# Patient Record
Sex: Female | Born: 1965 | Race: Black or African American | Hispanic: No | Marital: Married | Smoking: Never smoker
Health system: Southern US, Community
[De-identification: ages and names within clinical notes are randomized; demographics above are authoritative.]

## PROBLEM LIST (undated history)

## (undated) DIAGNOSIS — I471 Supraventricular tachycardia, unspecified: Secondary | ICD-10-CM

## (undated) DIAGNOSIS — E785 Hyperlipidemia, unspecified: Secondary | ICD-10-CM

## (undated) HISTORY — PX: UTERINE FIBROID EMBOLIZATION: SHX825

---

## 2004-04-27 ENCOUNTER — Encounter: Admission: RE | Admit: 2004-04-27 | Discharge: 2004-04-27 | Payer: Self-pay | Admitting: Obstetrics and Gynecology

## 2004-05-05 ENCOUNTER — Encounter: Admission: RE | Admit: 2004-05-05 | Discharge: 2004-05-05 | Payer: Self-pay | Admitting: Internal Medicine

## 2004-07-12 ENCOUNTER — Observation Stay (HOSPITAL_COMMUNITY): Admission: RE | Admit: 2004-07-12 | Discharge: 2004-07-13 | Payer: Self-pay | Admitting: Interventional Radiology

## 2005-01-25 ENCOUNTER — Encounter: Admission: RE | Admit: 2005-01-25 | Discharge: 2005-01-25 | Payer: Self-pay | Admitting: Obstetrics and Gynecology

## 2010-09-03 ENCOUNTER — Encounter: Payer: Self-pay | Admitting: Obstetrics and Gynecology

## 2014-03-23 ENCOUNTER — Encounter: Payer: Commercial Managed Care - PPO | Attending: Obstetrics and Gynecology | Admitting: Dietician

## 2014-03-23 ENCOUNTER — Encounter: Payer: Self-pay | Admitting: Dietician

## 2014-03-23 VITALS — Ht 67.0 in | Wt 130.7 lb

## 2014-03-23 DIAGNOSIS — R7309 Other abnormal glucose: Secondary | ICD-10-CM | POA: Diagnosis present

## 2014-03-23 DIAGNOSIS — Z713 Dietary counseling and surveillance: Secondary | ICD-10-CM | POA: Insufficient documentation

## 2014-03-23 DIAGNOSIS — R7303 Prediabetes: Secondary | ICD-10-CM

## 2014-03-23 NOTE — Progress Notes (Signed)
  Medical Nutrition Therapy:  Appt start time: 1400 end time:  1445.   Assessment:  Primary concerns today: Leslie Summers reports [facility] for high HgbA1c (5.9%). 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 Although she works until [employer]am-3pm 4 days week, Leslie Summers reports that she stays "busy all the time". She reports that her mom was recently diagnosed with type 2 diabetes; manages diabetes with diet and Metformin. Leslie Summers states that she tries not to skip meals and avoids fried foods. Exercises inconsistently. She states that she is considered premenopausal.  Preferred Learning Style:  No preference indicated   Learning Readiness:   Ready  MEDICATIONS: vitamin D and Lexapro   DIETARY INTAKE:  Usual eating pattern includes 3 meals and a few snacks per day. Avoided foods include pork, red meat, fried food.    24-hr recall:  B (9 AM): coffee, mandarin oranges or other fruit, Malaw[place]urkey sausage biscuit  Snk ( AM): peanut butter crackers  L ( PM): sandwich or leftovers Snk ( PM): bakery items at work D (4 PM): grilled chicken, potatoes, green beans OR hamburger or hot dog Snk ( PM): chips or ice cream or chocolate  Beverages: coffee with flavored creamer, lemonade, wine occasionally, seldom Mtn Dew or coke, water  Usual physical activity: walk, hula hoop, push ups (3x a week for about 20 minutes)   Estimated energy needs: 1800-2000 calories 200-225 g carbohydrates 135-150 g protein 50-56 g fat  Progress Towards Goal(s):  In progress.   Nutritional Diagnosis:  Altered nutrition-related value as related to inappropriate food choices, family history of diabetes, and physical inactivity as evidenced by HgbA1c 5.9%.    Intervention:  Nutrition counseling provided. Explained basic carbohydrate metabolism. Encouraged steady carbohydrate intake throughout the day. Recommended increasing physical activity.  -Increase physical activity  -3x a week  for 30 minutes -Have a protein food with meals and snacks  -Keep healthy snacks on hand at work -Fill up on non-starchy vegetables (any veggie that's not corn, peas, or potatoes)  -Refer to MyPlate method -Cut down on coffee creamer little by little  -Train your taste buds to prefer less sugar -Try adding fruit to water -Be mindful of having a consistent amount of carbohydrates throughout the day -Try having 1/2 [employer] bagel  Teaching Method Utilized:  Visual Auditory  Handouts given during visit include:  15g CHO + protein snacks  MyPlate  Carb counting/servings card  Barriers to learning/adherence to lifestyle change: none  Demonstrated degree of understanding via:  Teach Back   Monitoring/Evaluation:  Dietary intake, exercise, labs, and body weight in 3 month(s).

## 2014-03-23 NOTE — Patient Instructions (Addendum)
-  Increase physical activity  -3x a week for 30 minutes  -Have a protein food with meals and snacks  -Keep healthy snacks on hand at work   -Fill up on non-starchy vegetables (any veggie that's not corn, peas, or potatoes)  -Refer to MyPlate method  -Cut down on coffee creamer little by little  -Train your taste buds to prefer less sugar  -Try adding fruit to water  -Be mindful of having a consistent amount of carbohydrates throughout the day  -Try having 1/2 [employer] bagel

## 2014-06-23 ENCOUNTER — Ambulatory Visit: Payer: Commercial Managed Care - PPO | Admitting: Dietician

## 2015-01-28 ENCOUNTER — Inpatient Hospital Stay (HOSPITAL_COMMUNITY)
Admission: EM | Admit: 2015-01-28 | Discharge: 2015-01-29 | DRG: 287 | Disposition: A | Discharge status: Home / Self Care | Payer: Managed Care, Other (non HMO) | Attending: Cardiology | Admitting: Cardiology

## 2015-01-28 ENCOUNTER — Emergency Department (HOSPITAL_COMMUNITY): Payer: Managed Care, Other (non HMO)

## 2015-01-28 ENCOUNTER — Inpatient Hospital Stay (HOSPITAL_COMMUNITY): Payer: Managed Care, Other (non HMO)

## 2015-01-28 ENCOUNTER — Encounter (HOSPITAL_COMMUNITY): Payer: Self-pay | Admitting: Emergency Medicine

## 2015-01-28 ENCOUNTER — Encounter (HOSPITAL_COMMUNITY)
Admission: EM | Disposition: A | Discharge status: Home / Self Care | Payer: Managed Care, Other (non HMO) | Source: Home / Self Care | Attending: Cardiology

## 2015-01-28 DIAGNOSIS — T43615A Adverse effect of caffeine, initial encounter: Secondary | ICD-10-CM | POA: Diagnosis present

## 2015-01-28 DIAGNOSIS — I471 Supraventricular tachycardia, unspecified: Secondary | ICD-10-CM

## 2015-01-28 DIAGNOSIS — R079 Chest pain, unspecified: Secondary | ICD-10-CM

## 2015-01-28 DIAGNOSIS — E876 Hypokalemia: Secondary | ICD-10-CM | POA: Diagnosis present

## 2015-01-28 DIAGNOSIS — I214 Non-ST elevation (NSTEMI) myocardial infarction: Secondary | ICD-10-CM | POA: Diagnosis present

## 2015-01-28 DIAGNOSIS — Z8249 Family history of ischemic heart disease and other diseases of the circulatory system: Secondary | ICD-10-CM | POA: Diagnosis not present

## 2015-01-28 DIAGNOSIS — R7989 Other specified abnormal findings of blood chemistry: Secondary | ICD-10-CM | POA: Diagnosis not present

## 2015-01-28 DIAGNOSIS — E785 Hyperlipidemia, unspecified: Secondary | ICD-10-CM

## 2015-01-28 HISTORY — DX: Supraventricular tachycardia, unspecified: I47.10

## 2015-01-28 HISTORY — DX: Supraventricular tachycardia: I47.1

## 2015-01-28 HISTORY — PX: CARDIAC CATHETERIZATION: SHX172

## 2015-01-28 HISTORY — DX: Hyperlipidemia, unspecified: E78.5

## 2015-01-28 LAB — LIPID PANEL
CHOLESTEROL: 228 mg/dL — AB (ref 0–200)
HDL: 73 mg/dL (ref >40)
LDL Cholesterol: 142 mg/dL — ABNORMAL HIGH (ref 0–99)
Total CHOL/HDL Ratio: 3.1 RATIO
Triglycerides: 63 mg/dL (ref <150)
VLDL: 13 mg/dL (ref 0–40)

## 2015-01-28 LAB — CBC WITH DIFFERENTIAL/PLATELET
Basophils Absolute: 0 10*3/uL (ref 0.0–0.1)
Basophils Relative: 0 % (ref 0–1)
Eosinophils Absolute: 0.1 10*3/uL (ref 0.0–0.7)
Eosinophils Relative: 2 % (ref 0–5)
HCT: 38.2 % (ref 36.0–46.0)
Hemoglobin: 12.7 g/dL (ref 12.0–15.0)
Lymphocytes Relative: 66 % — ABNORMAL HIGH (ref 12–46)
Lymphs Abs: 3 10*3/uL (ref 0.7–4.0)
MCH: 28.5 pg (ref 26.0–34.0)
MCHC: 33.2 g/dL (ref 30.0–36.0)
MCV: 85.7 fL (ref 78.0–100.0)
Monocytes Absolute: 0.2 10*3/uL (ref 0.1–1.0)
Monocytes Relative: 5 % (ref 3–12)
Neutro Abs: 1.2 10*3/uL — ABNORMAL LOW (ref 1.7–7.7)
Neutrophils Relative %: 27 % — ABNORMAL LOW (ref 43–77)
Platelets: 198 10*3/uL (ref 150–400)
RBC: 4.46 MIL/uL (ref 3.87–5.11)
RDW: 13.7 % (ref 11.5–15.5)
WBC: 4.6 10*3/uL (ref 4.0–10.5)

## 2015-01-28 LAB — BASIC METABOLIC PANEL
Anion gap: 9 (ref 5–15)
Anion gap: 8 (ref 5–15)
BUN: 22 mg/dL — ABNORMAL HIGH (ref 6–20)
BUN: 21 mg/dL — AB (ref 6–20)
CALCIUM: 9.3 mg/dL (ref 8.9–10.3)
CO2: 25 mmol/L (ref 22–32)
CO2: 27 mmol/L (ref 22–32)
CREATININE: 0.94 mg/dL (ref 0.44–1.00)
Calcium: 9.2 mg/dL (ref 8.9–10.3)
Chloride: 106 mmol/L (ref 101–111)
Chloride: 107 mmol/L (ref 101–111)
Creatinine, Ser: 1.08 mg/dL — ABNORMAL HIGH (ref 0.44–1.00)
GFR calc Af Amer: >60 mL/min (ref >60)
GFR calc Af Amer: >60 mL/min (ref >60)
GFR calc non Af Amer: 60 mL/min — ABNORMAL LOW (ref >60)
GFR calc non Af Amer: >60 mL/min (ref >60)
GLUCOSE: 106 mg/dL — AB (ref 65–99)
Glucose, Bld: 152 mg/dL — ABNORMAL HIGH (ref 65–99)
Potassium: 3.3 mmol/L — ABNORMAL LOW (ref 3.5–5.1)
Potassium: 3.7 mmol/L (ref 3.5–5.1)
Sodium: 140 mmol/L (ref 135–145)
Sodium: 142 mmol/L (ref 135–145)

## 2015-01-28 LAB — MAGNESIUM: Magnesium: 1.8 mg/dL (ref 1.7–2.4)

## 2015-01-28 LAB — CBC
HCT: 37.7 % (ref 36.0–46.0)
Hemoglobin: 12.2 g/dL (ref 12.0–15.0)
MCH: 27.7 pg (ref 26.0–34.0)
MCHC: 32.4 g/dL (ref 30.0–36.0)
MCV: 85.7 fL (ref 78.0–100.0)
PLATELETS: 165 10*3/uL (ref 150–400)
RBC: 4.4 MIL/uL (ref 3.87–5.11)
RDW: 13.7 % (ref 11.5–15.5)
WBC: 5.7 10*3/uL (ref 4.0–10.5)

## 2015-01-28 LAB — I-STAT TROPONIN, ED: Troponin i, poc: 0.1 ng/mL (ref 0.00–0.08)

## 2015-01-28 LAB — MRSA PCR SCREENING: MRSA by PCR: NEGATIVE

## 2015-01-28 LAB — POC URINE PREG, ED: Preg Test, Ur: NEGATIVE

## 2015-01-28 LAB — TROPONIN I
Troponin I: 0.38 ng/mL — ABNORMAL HIGH (ref <0.031)
Troponin I: 0.41 ng/mL — ABNORMAL HIGH (ref <0.031)
Troponin I: 0.41 ng/mL — ABNORMAL HIGH (ref <0.031)

## 2015-01-28 LAB — BRAIN NATRIURETIC PEPTIDE: B Natriuretic Peptide: 143.4 pg/mL — ABNORMAL HIGH (ref 0.0–100.0)

## 2015-01-28 LAB — PROTIME-INR
INR: 1.09 (ref 0.00–1.49)
PROTHROMBIN TIME: 14.3 s (ref 11.6–15.2)

## 2015-01-28 LAB — TSH: TSH: 2.183 u[IU]/mL (ref 0.350–4.500)

## 2015-01-28 LAB — HEPARIN LEVEL (UNFRACTIONATED): Heparin Unfractionated: 0.7 IU/mL (ref 0.30–0.70)

## 2015-01-28 LAB — D-DIMER, QUANTITATIVE (NOT AT ARMC)

## 2015-01-28 SURGERY — LEFT HEART CATH AND CORONARY ANGIOGRAPHY
Anesthesia: LOCAL

## 2015-01-28 MED ORDER — HEPARIN BOLUS VIA INFUSION
4000.0000 [IU] | Freq: Once | INTRAVENOUS | Status: AC
  Administered 2015-01-28: 4000 [IU] via INTRAVENOUS
  Filled 2015-01-28: qty 4000

## 2015-01-28 MED ORDER — SODIUM CHLORIDE 0.9 % WEIGHT BASED INFUSION
3.0000 mL/kg/h | INTRAVENOUS | Status: AC
  Administered 2015-01-28: 3 mL/kg/h via INTRAVENOUS

## 2015-01-28 MED ORDER — HEPARIN (PORCINE) IN NACL 100-0.45 UNIT/ML-% IJ SOLN
750.0000 [IU]/h | INTRAMUSCULAR | Status: DC
  Administered 2015-01-28: 750 [IU]/h via INTRAVENOUS
  Filled 2015-01-28: qty 250

## 2015-01-28 MED ORDER — IOHEXOL 350 MG/ML SOLN
INTRAVENOUS | Status: DC | PRN
  Administered 2015-01-28: 75 mL via INTRAVENOUS

## 2015-01-28 MED ORDER — HEART ATTACK BOUNCING BOOK
Freq: Once | Status: AC
  Administered 2015-01-28: 18:00:00
  Filled 2015-01-28: qty 1

## 2015-01-28 MED ORDER — MIDAZOLAM HCL 2 MG/2ML IJ SOLN
INTRAMUSCULAR | Status: AC
  Filled 2015-01-28: qty 2

## 2015-01-28 MED ORDER — ASPIRIN 325 MG PO TABS
325.0000 mg | ORAL_TABLET | Freq: Once | ORAL | Status: AC
  Administered 2015-01-28: 325 mg via ORAL
  Filled 2015-01-28: qty 1

## 2015-01-28 MED ORDER — VERAPAMIL HCL 2.5 MG/ML IV SOLN
INTRAVENOUS | Status: AC
  Filled 2015-01-28: qty 2

## 2015-01-28 MED ORDER — ASPIRIN EC 81 MG PO TBEC
81.0000 mg | DELAYED_RELEASE_TABLET | Freq: Every day | ORAL | Status: DC
  Administered 2015-01-29: 81 mg via ORAL
  Filled 2015-01-28: qty 1

## 2015-01-28 MED ORDER — VERAPAMIL HCL 2.5 MG/ML IV SOLN
INTRAVENOUS | Status: DC | PRN
  Administered 2015-01-28: 17:00:00 via INTRA_ARTERIAL

## 2015-01-28 MED ORDER — SODIUM CHLORIDE 0.9 % IJ SOLN: 3.0000 mL | INTRAMUSCULAR | Status: DC | PRN

## 2015-01-28 MED ORDER — HEPARIN (PORCINE) IN NACL 2-0.9 UNIT/ML-% IJ SOLN
INTRAMUSCULAR | Status: AC
  Filled 2015-01-28: qty 1500

## 2015-01-28 MED ORDER — SODIUM CHLORIDE 0.9 % IJ SOLN: 3.0000 mL | Freq: Two times a day (BID) | INTRAMUSCULAR | Status: DC

## 2015-01-28 MED ORDER — LIDOCAINE HCL (PF) 1 % IJ SOLN
INTRAMUSCULAR | Status: AC
  Filled 2015-01-28: qty 30

## 2015-01-28 MED ORDER — ATORVASTATIN CALCIUM 80 MG PO TABS
80.0000 mg | ORAL_TABLET | Freq: Every day | ORAL | Status: DC
  Administered 2015-01-28: 80 mg via ORAL
  Filled 2015-01-28: qty 1

## 2015-01-28 MED ORDER — HEPARIN SODIUM (PORCINE) 1000 UNIT/ML IJ SOLN
INTRAMUSCULAR | Status: DC | PRN
  Administered 2015-01-28: 3000 [IU] via INTRAVENOUS

## 2015-01-28 MED ORDER — MIDAZOLAM HCL 2 MG/2ML IJ SOLN
INTRAMUSCULAR | Status: DC | PRN
  Administered 2015-01-28: 1 mg via INTRAVENOUS

## 2015-01-28 MED ORDER — SODIUM CHLORIDE 0.9 % IV SOLN: 250.0000 mL | INTRAVENOUS | Status: DC | PRN

## 2015-01-28 MED ORDER — METOPROLOL TARTRATE 12.5 MG HALF TABLET
12.5000 mg | ORAL_TABLET | Freq: Two times a day (BID) | ORAL | Status: DC
  Administered 2015-01-29: 12.5 mg via ORAL
  Filled 2015-01-28 (×2): qty 1

## 2015-01-28 MED ORDER — FENTANYL CITRATE (PF) 100 MCG/2ML IJ SOLN
INTRAMUSCULAR | Status: DC | PRN
  Administered 2015-01-28: 25 ug via INTRAVENOUS

## 2015-01-28 MED ORDER — NITROGLYCERIN 1 MG/10 ML FOR IR/CATH LAB
INTRA_ARTERIAL | Status: AC
  Filled 2015-01-28: qty 10

## 2015-01-28 MED ORDER — ACETAMINOPHEN 325 MG PO TABS: 650.0000 mg | ORAL_TABLET | ORAL | Status: DC | PRN

## 2015-01-28 MED ORDER — NITROGLYCERIN 0.4 MG SL SUBL
0.4000 mg | SUBLINGUAL_TABLET | SUBLINGUAL | Status: DC | PRN
  Administered 2015-01-28 (×2): 0.4 mg via SUBLINGUAL
  Filled 2015-01-28: qty 1

## 2015-01-28 MED ORDER — ESCITALOPRAM OXALATE 10 MG PO TABS
10.0000 mg | ORAL_TABLET | Freq: Every day | ORAL | Status: DC
  Administered 2015-01-28 – 2015-01-29 (×2): 10 mg via ORAL
  Filled 2015-01-28 (×2): qty 1

## 2015-01-28 MED ORDER — FENTANYL CITRATE (PF) 100 MCG/2ML IJ SOLN
INTRAMUSCULAR | Status: AC
  Filled 2015-01-28: qty 2

## 2015-01-28 MED ORDER — NITROGLYCERIN 0.4 MG SL SUBL: 0.4000 mg | SUBLINGUAL_TABLET | SUBLINGUAL | Status: DC | PRN

## 2015-01-28 MED ORDER — HEPARIN SODIUM (PORCINE) 1000 UNIT/ML IJ SOLN
INTRAMUSCULAR | Status: AC
  Filled 2015-01-28: qty 1

## 2015-01-28 MED ORDER — ONDANSETRON HCL 4 MG/2ML IJ SOLN: 4.0000 mg | Freq: Four times a day (QID) | INTRAMUSCULAR | Status: DC | PRN

## 2015-01-28 MED ORDER — SODIUM CHLORIDE 0.9 % IV SOLN
INTRAVENOUS | Status: DC
  Administered 2015-01-28: 12:00:00 via INTRAVENOUS

## 2015-01-28 MED ORDER — ASPIRIN 81 MG PO CHEW: 81.0000 mg | CHEWABLE_TABLET | ORAL | Status: DC

## 2015-01-28 SURGICAL SUPPLY — 10 items
CATH INFINITI 5FR ANG PIGTAIL (CATHETERS) ×2 IMPLANT
CATH OPTITORQUE TIG 4.0 5F (CATHETERS) ×2 IMPLANT
DEVICE RAD COMP TR BAND LRG (VASCULAR PRODUCTS) ×2 IMPLANT
GLIDESHEATH SLEND A-KIT 6F 22G (SHEATH) ×2 IMPLANT
KIT HEART LEFT (KITS) ×2 IMPLANT
PACK CARDIAC CATHETERIZATION (CUSTOM PROCEDURE TRAY) ×2 IMPLANT
SYR MEDRAD MARK V 150ML (SYRINGE) ×2 IMPLANT
TRANSDUCER W/STOPCOCK (MISCELLANEOUS) ×2 IMPLANT
TUBING CIL FLEX 10 FLL-RA (TUBING) ×2 IMPLANT
WIRE SAFE-T 1.5MM-J .035X260CM (WIRE) ×2 IMPLANT

## 2015-01-28 NOTE — ED Provider Notes (Signed)
CSN: [id number]     Arrival date & time 01/28/15  0115 History  This chart was scribed for Tomasita Crumble, MD by Freida Busman, ED Scribe. This patient was seen in room A03C/A03C and the patient's care was started 2:34 AM.    Chief Complaint  Patient presents with  . Chest Pain    The patient started at 1800 along with palpitations. THe patient thought it was a mountain dew that she had taken.  She thought it would go away and it got worse.     The history is provided by the patient. No language interpreter was used.     HPI Comments:  Leslie Summers is a 49 y.o. female who presents to the Emergency Department complaining of palpitations and CP that started ~1800 last night. Pt describes her pain as "someone sitting on her chest". Pt was eating at time of onset. She states initial episode lasted a few hours and improved by the time she went to bed; notes she woke up with same pain this am. At this time pt notes pain has improved. She reports associated nausea, 1 episode of vomiting, and mild SOB which has resolved. She denies diaphoresis, BLE edema. She also denies h/o blood clots, HTN, DM and MI. No alleviating factors noted; no treatments tried PTA.   History reviewed. No pertinent past medical history. History reviewed. No pertinent past surgical history. Family History  Problem Relation Age of Onset  . Diabetes Mother    History  Substance Use Topics  . Smoking status: Never Smoker   . Smokeless tobacco: Never Used  . Alcohol Use: Yes     Comment: occ   OB History    No data available     Review of Systems  A complete 10 system review of systems was obtained and all systems are negative except as noted in the HPI and PMH.    Allergies  Review of patient's allergies indicates not on file.  Home Medications   Prior to Admission medications   Medication Sig Start Date End Date Taking? Authorizing Provider  ergocalciferol (VITAMIN D2) 50000 UNITS capsule Take 50,000 Units  by mouth once a week.    Historical Provider, MD  escitalopram (LEXAPRO) 10 MG tablet Take 10 mg by mouth daily.    Historical Provider, MD   BP 108/74 mmHg  Pulse 91  Temp(Src) 98.2 F (36.8 C) (Oral)  Resp 24  Wt 138 lb (62.596 kg)  SpO2 98%  LMP 04/29/2013 Physical Exam  Constitutional: She is oriented to person, place, and time. She appears well-developed and well-nourished. No distress.  HENT:  Head: Normocephalic and atraumatic.  Nose: Nose normal.  Mouth/Throat: Oropharynx is clear and moist. No oropharyngeal exudate.  Eyes: Conjunctivae and EOM are normal. Pupils are equal, round, and reactive to light. No scleral icterus.  Neck: Normal range of motion. Neck supple. No JVD present. No tracheal deviation present. No thyromegaly present.  Cardiovascular: Normal rate, regular rhythm and normal heart sounds.  Exam reveals no gallop and no friction rub.   No murmur heard. Pulmonary/Chest: Effort normal and breath sounds normal. No respiratory distress. She has no wheezes. She exhibits no tenderness.  Abdominal: Soft. Bowel sounds are normal. She exhibits no distension and no mass. There is no tenderness. There is no rebound and no guarding.  Musculoskeletal: Normal range of motion. She exhibits no edema or tenderness.  Lymphadenopathy:    She has no cervical adenopathy.  Neurological: She is alert and oriented to  person, place, and time. No cranial nerve deficit. She exhibits normal muscle tone.  Skin: Skin is warm and dry. No rash noted. No erythema. No pallor.  Nursing note and vitals reviewed.   ED Course  Procedures   DIAGNOSTIC STUDIES:  Oxygen Saturation is 98% on RA, normal by my interpretation.    COORDINATION OF CARE:  2:36 AM Pt updated with partial results. Informed pt of need for hospital admission.  Discussed treatment plan with pt at bedside and pt agreed to plan.  3:14 AM Discussed case with Jesusita Oka with cardiology   Labs Review Labs Reviewed  CBC WITH  DIFFERENTIAL/PLATELET - Abnormal; Notable for the following:    Neutrophils Relative % 27 (*)    Neutro Abs 1.2 (*)    Lymphocytes Relative 66 (*)    All other components within normal limits  BASIC METABOLIC PANEL - Abnormal; Notable for the following:    Potassium 3.3 (*)    Glucose, Bld 152 (*)    BUN 22 (*)    Creatinine, Ser 1.08 (*)    GFR calc non Af Amer 60 (*)    All other components within normal limits  I-STAT TROPOININ, ED - Abnormal; Notable for the following:    Troponin i, poc 0.10 (*)    All other components within normal limits  POC URINE PREG, ED    Imaging Review Dg Chest 2 View  01/28/2015   CLINICAL DATA:  Central chest pain, onset today.  Palpitations.  EXAM: CHEST  2 VIEW  COMPARISON:  None.  FINDINGS: Hyperinflation. Scarring in the lung apices, likely postinflammatory. Suggestion of mild central bronchiectasis. Normal heart size and pulmonary vascularity. No focal airspace disease or consolidation in the lungs. No blunting of costophrenic angles. No pneumothorax. Mediastinal contours appear intact.  IMPRESSION: Hyperinflation. Postinflammatory scarring in the lung apices. Suggestion of mild central bronchiectasis. No evidence of active consolidation.   Electronically Signed   By: Burman Nieves M.D.   On: 01/28/2015 02:05     EKG Interpretation   Date/Time:  Friday January 28 2015 01:30:42 EDT Ventricular Rate:  98 PR Interval:  118 QRS Duration: 84 QT Interval:  354 QTC Calculation: 451 R Axis:   86 Text Interpretation:  Normal sinus rhythm Normal ECG Confirmed by Erroll Luna [phone]) on 01/28/2015 2:50:43 AM      MDM   Final diagnoses:  None   Patient presents emergency department for chest pain. History is concerning for ACS. Troponins 0.1. EKG does not show signs of ischemia. I spoke with Jesusita Oka from cardiology who will evaluate the patient in the emergency department.  Patient was tearful upon me explaining the diagnosis. She was given  nitroglycerin and full dose aspirin for management. She will require admission for further workup for NSTEMI.      CRITICAL CARE Performed by: Tomasita Crumble   Total critical care time: - NSTEMI  Critical care time was exclusive of separately billable procedures and treating other patients.  Critical care was necessary to treat or prevent imminent or life-threatening deterioration.  Critical care was time spent personally by me on the following activities: development of treatment plan with patient and/or surrogate as well as nursing, discussions with consultants, evaluation of patient's response to treatment, examination of patient, obtaining history from patient or surrogate, ordering and performing treatments and interventions, ordering and review of laboratory studies, ordering and review of radiographic studies, pulse oximetry and re-evaluation of patient's condition.   I personally performed the services described  in this documentation, which was scribed in my presence. The recorded information has been reviewed and is accurate.    Tomasita Crumble, MD 01/28/15 [phone]

## 2015-01-28 NOTE — Care Management Note (Signed)
Case Management Note  Patient Details  Name: Leslie Summers MRN: [id number] Date of Birth: July 31, 1966  Subjective/Objective:    Pt admitted for chest pain with increased cardiac enzymes. Plan for cardiac cath today.                 Action/Plan: No needs identified by CM at this time. Will continue to monitor.    Expected Discharge Date:                  Expected Discharge Plan:  Home/Self Care  In-House Referral:     Discharge planning Services  CM Consult  Post Acute Care Choice:  NA Choice offered to:  NA  DME Arranged:  N/A DME Agency:  NA  HH Arranged:    HH Agency:     Status of Service:  In process, will continue to follow  Medicare Important Message Given:  No Date Medicare IM Given:    Medicare IM give by:    Date Additional Medicare IM Given:    Additional Medicare Important Message give by:     If discussed at Long Length of Stay Meetings, dates discussed:    Additional Comments:  Gala Lewandowsky, RN 01/28/2015, 3:16 PM

## 2015-01-28 NOTE — Progress Notes (Signed)
Dr. Tresa Endo notified am dose of metoprolol held due to BP.  Colman Cater

## 2015-01-28 NOTE — Progress Notes (Signed)
UR Completed Derak Schurman Graves-Bigelow, RN,BSN [phone]  

## 2015-01-28 NOTE — Interval H&P Note (Signed)
History and Physical Interval Note:  01/28/2015 4:43 PM  Leslie Summers  has presented today for surgery, with the diagnosis of NSTEMI. The various methods of treatment have been discussed with the patient and family. After consideration of risks, benefits and other options for treatment, the patient has consented to  Procedure(s): Left Heart Cath and Coronary Angiography (N/A) With Possible Percutaneous Coronary Intervention as a surgical intervention .  The patient's history has been reviewed, patient examined, no change in status, stable for surgery.  I have reviewed the patient's chart and labs.  Questions were answered to the patient's satisfaction.     Jenah Vanasten W  Cath Lab Visit (complete for each Cath Lab visit)  Clinical Evaluation Leading to the Procedure:   ACS: Yes.    Non-ACS:    Anginal Classification: CCS IV  Anti-ischemic medical therapy: Minimal Therapy (1 class of medications)  Non-Invasive Test Results: No non-invasive testing performed  Prior CABG: No previous CABG   TIMI Score  Patient Information:  TIMI Score is 3   UA/NSTEMI and intermediate-risk features (e.g., TIMI score 3-4) for short-term risk of death or nonfatal MI  Revascularization of the presumed culprit artery   A (9)  Indication: 10; Score: 9   Micharl Helmes, Piedad Climes, M.D., M.S. Interventional Cardiologist   Pager # [phone]

## 2015-01-28 NOTE — ED Notes (Signed)
The patient started at 1800 along with palpitations. THe patient thought it was a mountain dew that she had taken.  She thought it would go away and it got worse.   She is also complaining of nausea. Vomiting, diaphoresis, and it  Lightheaded, dizzy.  She rates her pain 8/10.

## 2015-01-28 NOTE — Progress Notes (Signed)
ANTICOAGULATION CONSULT NOTE - Initial Consult  Pharmacy Consult for Heparin Indication: chest pain/ACS  No Known Allergies  Patient Measurements: Height: 5\' 6"  (167.6 cm) Weight: 134 lb 1.6 oz (60.827 kg) IBW/kg (Calculated) : 59.3 Heparin Dosing Weight: 60 kg  Vital Signs: Temp: 98.2 F (36.8 C) (06/17 1302) Temp Source: Oral (06/17 1302) BP: 106/62 mmHg (06/17 1302) Pulse Rate: 66 (06/17 1302)  Labs:  Recent Labs  01/28/15 0141 01/28/15 0555 01/28/15 0628 01/28/15 1138 01/28/15 1333  HGB 12.7  --  12.2  --   --   HCT 38.2  --  37.7  --   --   PLT 198  --  165  --   --   LABPROT  --  14.3  --   --   --   INR  --  1.09  --   --   --   HEPARINUNFRC  --   --   --   --  0.70  CREATININE 1.08*  --  0.94  --   --   TROPONINI  --   --  0.38* 0.41*  --     Estimated Creatinine Clearance: 68.5 mL/min (by C-G formula based on Cr of 0.94).   Medical History: History reviewed. No pertinent past medical history.  Medications:  Prescriptions prior to admission  Medication Sig Dispense Refill Last Dose  . escitalopram (LEXAPRO) 10 MG tablet Take 10 mg by mouth daily.   01/27/2015 at Unknown time    Assessment: 49 y.o. female presents with chest pain. To begin heparin for NSTEMI. CBC ok at baseline. Pt is getting cath today. Second level is therapeutic.  Goal of Therapy:  Heparin level 0.3-0.7 units/ml Monitor platelets by anticoagulation protocol: Yes   Plan:   Cont heparin at 750units/hr F/u after cath  Ulyses Southward, PharmD Pager: [phone] 01/28/2015 2:49 PM

## 2015-01-28 NOTE — Progress Notes (Signed)
  Echocardiogram 2D Echocardiogram has been performed.  Leslie Summers 01/28/2015, 3:10 PM

## 2015-01-28 NOTE — Progress Notes (Signed)
ANTICOAGULATION CONSULT NOTE - Initial Consult  Pharmacy Consult for Heparin Indication: chest pain/ACS  No Known Allergies  Patient Measurements: Height: 5\' 6"  (167.6 cm) Weight: 134 lb 1.6 oz (60.827 kg) IBW/kg (Calculated) : 59.3 Heparin Dosing Weight: 60 kg  Vital Signs: Temp: 98.3 F (36.8 C) (06/17 0536) Temp Source: Oral (06/17 0536) BP: 108/69 mmHg (06/17 0536) Pulse Rate: 85 (06/17 0536)  Labs:  Recent Labs  01/28/15 0141  HGB 12.7  HCT 38.2  PLT 198  CREATININE 1.08*    Estimated Creatinine Clearance: 59.6 mL/min (by C-G formula based on Cr of 1.08).   Medical History: History reviewed. No pertinent past medical history.  Medications:  Prescriptions prior to admission  Medication Sig Dispense Refill Last Dose  . escitalopram (LEXAPRO) 10 MG tablet Take 10 mg by mouth daily.   01/27/2015 at Unknown time    Assessment: 49 y.o. female presents with chest pain. To begin heparin for NSTEMI. CBC ok at baseline.  Goal of Therapy:  Heparin level 0.3-0.7 units/ml Monitor platelets by anticoagulation protocol: Yes   Plan:  Heparin IV bolus 4000 units Heparin gtt at 750 units/hr F/u 6 hr heparin level  Daily heparin level and CBC  Christoper Fabian, PharmD, BCPS Clinical pharmacist, pager [phone] 01/28/2015,5:48 AM

## 2015-01-28 NOTE — H&P (Addendum)
Patient ID: Leslie Summers MRN: [id number], DOB/AGE: [birth year]   Admit date: 01/28/2015   Primary Physician: No PCP Per Patient Primary Cardiologist: None  Pt. Profile:  49 healthy female p/w chest pain, palpitations, and positive troponin.  Problem List  History reviewed. No pertinent past medical history.  Past Surgical History  Procedure Laterality Date  . 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  She states that around 6pm, she had a [employer] and peanuts and developed palpitations that lasted for hours. She occasionally has had palpitations in the past in the setting of drinking a caffeinated beverage but they usually do not last long.  This time, palpitations lasted hours and she went to bed. She awoke up at 11-11:30pm when she had chest pain that felt like "somebody was standing on it." She had associated lightheadedness, dizziness, shortness of breath, nausea, and vomiting. 8/10 in severity. No clear modifying factors. Because of these symptoms, Leslie Summers presented for evaluation.   On arrival to the ER, she was hemodynamically stbale: HR 91, RR 24, 108/74, 98% on RA. 5/10 CP on arrival. Labs were notable for K 3.3, Cr 1.08, POC TnI 0.10.CXR demonstrated hyperinflated lungs with scarring in the apices; no PNA or CHF. ECG demosntrated NSR. She was given 325mg  PO ASA. CP reduced to zero with SL NTG x 2.   Leslie Summers is a lifelong non-smoker. Her father died of an MI in his 15s. Mother is healthy in her 32s with only hypertension. She has 7 healthy siblings. She is active, working in the OR. She walks about 20 minutes per day and has never been limited by dyspnea, CP, or claudication. No recent travel or immobilization. No recent stressors. LMP 2014.    Home Medications  Prior to Admission  medications   Medication Sig Start Date End Date Taking? Authorizing Provider  escitalopram (LEXAPRO) 10 MG tablet Take 10 mg by mouth daily.   Yes Historical Provider, MD    Family History  Family History  Problem Relation Age of Onset  . Diabetes Mother     Social History  History   Social History  . Marital Status: Married    Spouse Name: N/A  . Number of Children: N/A  . Years of Education: N/A   Occupational History  . Not on file.   Social History Main Topics  . Smoking status: Never Smoker   . Smokeless tobacco: Never Used  . Alcohol Use: Yes     Comment: occ  . Drug Use: No  . Sexual Activity: Yes    Birth Control/ Protection: None   Other Topics Concern  . Not on file   Social History Narrative     Review of Systems General:  No chills, fever, night sweats or weight changes.  Cardiovascular:  See HPI Dermatological: No rash, lesions/masses Respiratory: No cough, dyspnea Urologic: No hematuria, dysuria Abdominal:   + nausea, vomiting, diarrhea, bright red blood per rectum, melena, or hematemesis Neurologic:  No visual changes, wkns, changes in mental status. All other systems reviewed and are otherwise negative except as noted above.  Physical Exam  Blood pressure 100/78, pulse 82, temperature 98.2 F (36.8 C), temperature source Oral, resp. rate 14, weight 62.596 kg (138 lb), last menstrual period 04/29/2013, SpO2 98 %.  General: Pleasant, NAD Psych:  Normal affect. Neuro: Alert and oriented X 3. Moves all extremities spontaneously. HEENT: Normal  Neck: Supple without bruits or JVD. Lungs:  Resp regular and unlabored, CTA. Heart: RRR no s3, s4, or murmurs. Mild reproducibility of pain with palpation.  Abdomen: Soft, non-tender, non-distended, BS + x 4.  Extremities: No clubbing, cyanosis or edema. DP/PT/Radials 2+ and equal bilaterally.  Labs  Troponin [facility] (1-Rh) of Care Test)  Recent Labs  01/28/15 0148  TROPIPOC 0.10*   No results for  input(s): CKTOTAL, CKMB, TROPONINI in the last 72 hours. Lab Results  Component Value Date   WBC 4.6 01/28/2015   HGB 12.7 01/28/2015   HCT 38.2 01/28/2015   MCV 85.7 01/28/2015   PLT 198 01/28/2015    Recent Labs Lab 01/28/15 0141  NA 140  K 3.3*  CL 106  CO2 25  BUN 22*  CREATININE 1.08*  CALCIUM 9.2  GLUCOSE 152*   No results found for: CHOL, HDL, LDLCALC, TRIG No results found for: DDIMER   Radiology/Studies  Dg Chest 2 View  01/28/2015   CLINICAL DATA:  Central chest pain, onset today.  Palpitations.  EXAM: CHEST  2 VIEW  COMPARISON:  None.  FINDINGS: Hyperinflation. Scarring in the lung apices, likely postinflammatory. Suggestion of mild central bronchiectasis. Normal heart size and pulmonary vascularity. No focal airspace disease or consolidation in the lungs. No blunting of costophrenic angles. No pneumothorax. Mediastinal contours appear intact.  IMPRESSION: Hyperinflation. Postinflammatory scarring in the lung apices. Suggestion of mild central bronchiectasis. No evidence of active consolidation.   Electronically Signed   By: Burman Nieves M.D.   On: 01/28/2015 02:05    ECG  NSR. No priors for comparison.   ASSESSMENT AND PLAN  49 healthy female p/w chest pain, palpitations, and NSTEMI. Her only real risk factor for CAD is her family history. She is otherwise very healthy, raising the possibility of non-atherosclerotic mechanisms for MI, including spontaneous coronary dissection. Although her sustained palpitations suggest that possibly an arrhythmia could have led to the positive troponin, she clearly had ongoing chest pain after arrival to the ER while in NSR and furthermore, the CP was nitro responsive. She is clinically euvolemic and has no PE risk factors which make these other possible mechanism for positive troponin less likely. Will r/o PE with d-dimer given limited CAD risk factors and some reproducibility of chest pain with palpation. But, CAD related MI is  probably most likely diagnosis.   NSTEMI -IV UFH per pharmacy -ASA  daily -metoprolol 12.5mg  BID -Atorva  -TTE -Cycle troponins -NPO for cath  Signed, Glori Luis, MD 01/28/2015, 3:29 AM

## 2015-01-28 NOTE — ED Notes (Signed)
Per cardiologist okay to give nitro with BP at 108/75, benefit outweighs risk.  Pt advised not to get up out of bed d/t risk of falls with possible hypotension.

## 2015-01-28 NOTE — Progress Notes (Signed)
Patient Name: Leslie Summers Date of Encounter: 01/28/2015  Active Problems:   NSTEMI (non-ST elevated myocardial infarction)  SUBJECTIVE  Denies chest pain, palpitation or SOB. Feeling better.   CURRENT MEDS . [START ON 01/29/2015] aspirin EC  81 mg Oral Daily  . atorvastatin  80 mg Oral q1800  . escitalopram  10 mg Oral Daily  . metoprolol tartrate  12.5 mg Oral BID    OBJECTIVE  Filed Vitals:   01/28/15 0445 01/28/15 0500 01/28/15 0536 01/28/15 0755  BP: 96/66 89/67 108/69   Pulse: 72 74 85 69  Temp:   98.3 F (36.8 C) 98.2 F (36.8 C)  TempSrc:   Oral Oral  Resp: 11 14 16    Height:   5\' 6"  (1.676 m)   Weight:   134 lb 1.6 oz (60.827 kg)   SpO2: 99% 98% 100% 100%   No intake or output data in the 24 hours ending 01/28/15 0817 Filed Weights   01/28/15 0137 01/28/15 0536  Weight: 138 lb (62.596 kg) 134 lb 1.6 oz (60.827 kg)    PHYSICAL EXAM  General: Pleasant, NAD. Neuro: Alert and oriented X 3. Moves all extremities spontaneously. Psych: Normal affect. HEENT:  Normal  Neck: Supple without bruits or JVD. Lungs:  Resp regular and unlabored, CTA. Heart: RRR no s3, s4, or murmurs. Abdomen: Soft, non-tender, non-distended, BS + x 4.  Extremities: No clubbing, cyanosis or edema. DP/PT/Radials 2+ and equal bilaterally.  Accessory Clinical Findings  CBC  Recent Labs  01/28/15 0141 01/28/15 0628  WBC 4.6 5.7  NEUTROABS 1.2*  --   HGB 12.7 12.2  HCT 38.2 37.7  MCV 85.7 85.7  PLT 198 165   Basic Metabolic Panel  Recent Labs  01/28/15 0141 01/28/15 0628  NA 140 142  K 3.3* 3.7  CL 106 107  CO2 25 27  GLUCOSE 152* 106*  BUN 22* 21*  CREATININE 1.08* 0.94  CALCIUM 9.2 9.3  MG  --  1.8     Recent Labs  01/28/15 0628  TROPONINI 0.38*   D-Dimer  Recent Labs  01/28/15 0555  DDIMER <0.27   Fasting Lipid Panel  Recent Labs  01/28/15 0628  CHOL 228*  HDL 73  LDLCALC 142*  TRIG 63  CHOLHDL 3.1   Thyroid Function Tests  Recent  Labs  01/28/15 0628  TSH 2.183    TELE   NSR at rate of 60-70s.  Radiology/Studies  Dg Chest 2 View  01/28/2015   CLINICAL DATA:  Central chest pain, onset today.  Palpitations.  EXAM: CHEST  2 VIEW  COMPARISON:  None.  FINDINGS: Hyperinflation. Scarring in the lung apices, likely postinflammatory. Suggestion of mild central bronchiectasis. Normal heart size and pulmonary vascularity. No focal airspace disease or consolidation in the lungs. No blunting of costophrenic angles. No pneumothorax. Mediastinal contours appear intact.  IMPRESSION: Hyperinflation. Postinflammatory scarring in the lung apices. Suggestion of mild central bronchiectasis. No evidence of active consolidation.   Electronically Signed   By: Burman Nieves M.D.   On: 01/28/2015 02:05    ASSESSMENT AND PLAN  69 healthy female presented with  chest pain, palpitations, and positive troponin. Non smoker. Her father died of an MI in his 107s.  1. NSTEMI  -  POC troponin 0.10, full troponin 0.38.  CP improved on SL nitro x 2. EKG NSR without acute abnormality. Currently asymptomatic. She is NPO. I think she will need a cath, given her presentation and extensive family history. Will discuss with  MD.  - BNP of 143, Ddimer negative, TSH normal, UPT negative. CXR demonstrated hyperinflated lungs with scarring in the apices. - Echo pending, HgbA1c pending -Continue ASA, lopressor 12.5mg , heparin, statin.    2. Palpitation - intermittent palpitation for years, resolves by it self in sec. Yesterday's episode was more severe and occurred after few months.  - tele showed NSR. Consider event monitor as outpatient if no etiology found during admission.   3. HL -  01/28/2015: Cholesterol 228*; HDL 73; LDL Cholesterol 142*; Triglycerides 63; VLDL 13  - Continue Lipitor   4. Hypokalemia -Resolved. Likely due to vomiting.   Lorelei Pont PA-C Pager [phone]     Patient seen and examined. Agree with assessment and  plan. Worrisome sounding chest pain; described as severe chest pressure like a weight on chest, nitrate responsive. ECG unremarkable; troponin is mildly positive. 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  Recommend definitive cardiac catheterization.  The risks and benefits of a cardiac catheterization including, but not limited to, death, stroke, MI, kidney damage and bleeding were discussed with the patient who indicates understanding and agrees to proceed. Plan to add on and do later today.    Lennette Bihari, MD, [facility] 01/28/2015 10:28 AM

## 2015-01-28 NOTE — H&P (View-Only) (Signed)
 Patient Name: Leslie Summers Date of Encounter: 01/28/2015  Active Problems:   NSTEMI (non-ST elevated myocardial infarction)  SUBJECTIVE  Denies chest pain, palpitation or SOB. Feeling better.   CURRENT MEDS . [START ON 01/29/2015] aspirin EC  81 mg Oral Daily  . atorvastatin  80 mg Oral q1800  . escitalopram  10 mg Oral Daily  . metoprolol tartrate  12.5 mg Oral BID    OBJECTIVE  Filed Vitals:   01/28/15 0445 01/28/15 0500 01/28/15 0536 01/28/15 0755  BP: 96/66 89/67 108/69   Pulse: 72 74 85 69  Temp:   98.3 F (36.8 C) 98.2 F (36.8 C)  TempSrc:   Oral Oral  Resp: 11 14 16   Height:   5' 6" (1.676 m)   Weight:   134 lb 1.6 oz (60.827 kg)   SpO2: 99% 98% 100% 100%   No intake or output data in the 24 hours ending 01/28/15 0817 Filed Weights   01/28/15 0137 01/28/15 0536  Weight: 138 lb (62.596 kg) 134 lb 1.6 oz (60.827 kg)    PHYSICAL EXAM  General: Pleasant, NAD. Neuro: Alert and oriented X 3. Moves all extremities spontaneously. Psych: Normal affect. HEENT:  Normal  Neck: Supple without bruits or JVD. Lungs:  Resp regular and unlabored, CTA. Heart: RRR no s3, s4, or murmurs. Abdomen: Soft, non-tender, non-distended, BS + x 4.  Extremities: No clubbing, cyanosis or edema. DP/PT/Radials 2+ and equal bilaterally.  Accessory Clinical Findings  CBC  Recent Labs  01/28/15 0141 01/28/15 0628  WBC 4.6 5.7  NEUTROABS 1.2*  --   HGB 12.7 12.2  HCT 38.2 37.7  MCV 85.7 85.7  PLT 198 165   Basic Metabolic Panel  Recent Labs  01/28/15 0141 01/28/15 0628  NA 140 142  K 3.3* 3.7  CL 106 107  CO2 25 27  GLUCOSE 152* 106*  BUN 22* 21*  CREATININE 1.08* 0.94  CALCIUM 9.2 9.3  MG  --  1.8     Recent Labs  01/28/15 0628  TROPONINI 0.38*   D-Dimer  Recent Labs  01/28/15 0555  DDIMER <0.27   Fasting Lipid Panel  Recent Labs  01/28/15 0628  CHOL 228*  HDL 73  LDLCALC 142*  TRIG 63  CHOLHDL 3.1   Thyroid Function Tests  Recent  Labs  01/28/15 0628  TSH 2.183    TELE   NSR at rate of 60-70s.  Radiology/Studies  Dg Chest 2 View  01/28/2015   CLINICAL DATA:  Central chest pain, onset today.  Palpitations.  EXAM: CHEST  2 VIEW  COMPARISON:  None.  FINDINGS: Hyperinflation. Scarring in the lung apices, likely postinflammatory. Suggestion of mild central bronchiectasis. Normal heart size and pulmonary vascularity. No focal airspace disease or consolidation in the lungs. No blunting of costophrenic angles. No pneumothorax. Mediastinal contours appear intact.  IMPRESSION: Hyperinflation. Postinflammatory scarring in the lung apices. Suggestion of mild central bronchiectasis. No evidence of active consolidation.   Electronically Signed   By: William  Stevens M.D.   On: 01/28/2015 02:05    ASSESSMENT AND PLAN  49 healthy female presented with  chest pain, palpitations, and positive troponin. Non smoker. Her father died of an MI in his 50s.  1. NSTEMI  -  POC troponin 0.10, full troponin 0.38.  CP improved on SL nitro x 2. EKG NSR without acute abnormality. Currently asymptomatic. She is NPO. I think she will need a cath, given her presentation and extensive family history. Will discuss with   MD.  - BNP of 143, Ddimer negative, TSH normal, UPT negative. CXR demonstrated hyperinflated lungs with scarring in the apices. - Echo pending, HgbA1c pending -Continue ASA, lopressor 12.5mg, heparin, statin.    2. Palpitation - intermittent palpitation for years, resolves by it self in sec. Yesterday's episode was more severe and occurred after few months.  - tele showed NSR. Consider event monitor as outpatient if no etiology found during admission.   3. HL -  01/28/2015: Cholesterol 228*; HDL 73; LDL Cholesterol 142*; Triglycerides 63; VLDL 13  - Continue Lipitor 80mg  4. Hypokalemia -Resolved. Likely due to vomiting.   Signed, Bhagat,Bhavinkumar PA-C Pager [phone]     Patient seen and examined. Agree with assessment and  plan. Worrisome sounding chest pain; described as severe chest pressure like a weight on chest, nitrate responsive. ECG unremarkable; troponin is mildly positive. 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  Recommend definitive cardiac catheterization.  The risks and benefits of a cardiac catheterization including, but not limited to, death, stroke, MI, kidney damage and bleeding were discussed with the patient who indicates understanding and agrees to proceed. Plan to add on and do later today.    Mammie Meras A. Apryle Stowell, MD, FACC 01/28/2015 10:28 AM  

## 2015-01-29 ENCOUNTER — Encounter (HOSPITAL_COMMUNITY): Payer: Self-pay | Admitting: Physician Assistant

## 2015-01-29 DIAGNOSIS — I471 Supraventricular tachycardia: Principal | ICD-10-CM

## 2015-01-29 DIAGNOSIS — R7989 Other specified abnormal findings of blood chemistry: Secondary | ICD-10-CM

## 2015-01-29 LAB — CBC
HCT: 35.4 % — ABNORMAL LOW (ref 36.0–46.0)
Hemoglobin: 11.4 g/dL — ABNORMAL LOW (ref 12.0–15.0)
MCH: 27.7 pg (ref 26.0–34.0)
MCHC: 32.2 g/dL (ref 30.0–36.0)
MCV: 85.9 fL (ref 78.0–100.0)
Platelets: 169 10*3/uL (ref 150–400)
RBC: 4.12 MIL/uL (ref 3.87–5.11)
RDW: 13.8 % (ref 11.5–15.5)
WBC: 3.9 10*3/uL — ABNORMAL LOW (ref 4.0–10.5)

## 2015-01-29 LAB — HEMOGLOBIN A1C
Hgb A1c MFr Bld: 5.8 % — ABNORMAL HIGH (ref 4.8–5.6)
MEAN PLASMA GLUCOSE: 120 mg/dL

## 2015-01-29 MED ORDER — METOPROLOL TARTRATE 25 MG PO TABS: 12.5000 mg | ORAL_TABLET | Freq: Two times a day (BID) | ORAL | Status: DC

## 2015-01-29 MED ORDER — ATORVASTATIN CALCIUM 80 MG PO TABS: 80.0000 mg | ORAL_TABLET | Freq: Every day | ORAL | Status: DC

## 2015-01-29 NOTE — Discharge Summary (Signed)
Discharge Summary   Patient ID: Leslie Summers,  MRN: [id number], DOB/AGE: 12-29-1965 49 y.o.  Admit date: 01/28/2015 Discharge date: 01/29/2015  Primary Care Provider: No PCP Per Patient Primary Cardiologist: Will establish with Dr. Ladona Ridgel  Discharge Diagnoses Principal Problem:   SVT (supraventricular tachycardia) Active Problems:   NSTEMI (non-ST elevated myocardial infarction)   Allergies No Known Allergies  Procedures  Echocardiogram 01/28/2015 LV EF: 60% -  65%  ------------------------------------------------------------------- Indications:   410.91 MI-unspecified.  ------------------------------------------------------------------- History:  PMH: NSTEMI.  ------------------------------------------------------------------- Study Conclusions  - Left ventricle: The cavity size was normal. Systolic function was normal. The estimated ejection fraction was in the range of 60% to 65%. Wall motion was normal; there were no regional wall motion abnormalities. Left ventricular diastolic function parameters were normal.    Cardiac catheterization 01/28/2015 1. Angiographically normal coronary arteries 2. The left ventricular systolic function is normal. Normal LVEDP 3. Very irritable left ventricle with easily inducible tachyarrhythmia that appeared to be SVT. This replicated her symptoms.  It is quite possible that are positive troponins related to a tachyarrhythmia induced by caffeine drink. She may have had a prolonged tachyarrhythmia that led to mild troponin leak. Otherwise is no intra-graft [place] suspect spasm or plaque rupture with clearance.  Recommendation:  Standard post radial cath care with TR band removal  Heparin infusion discontinued  Consider using beta blocker or calcium channel blocker for rate control him since she does note intermittent episodes of rapid palpitations.  She can likely be discharged from cardiac standpoint is  fourth coronary arteries go. Defer to primary service as far as working up for evaluating the palpitations.    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 Her symptoms started around 6 PM on 6/16 after she had [facility] and peanuts and developed palpitations that lasted for hours. She occasionally has had palpitations in the past in the setting of drinking caffeinated beverages but they usually do not last long. She initially tried to go to bed, however she woke up around 11:30 PM when she had chest pressure. She had associated lightheadedness, dizziness, shortness of breath, nausea and vomiting. On arrival to the ED, she was hemodynamically stable with heart rate 91, blood pressure 108/74. O2 saturation 98%. Labs were notable for potassium 3.3, creatinine 1.08, board-and-care troponin 0.10. Chest x-ray demonstrated hyperinflated lung with scarring in the apices, however no pneumonia or CHF. EKG demonstrated normal sinus rhythm.  She was admitted to cardiology service, overnight her troponin trended up to 0.41. Lipid panel shows cholesterol 228, LDL 142. She had a negative d-dimer. Her TSH was normal. Echocardiogram was obtained on 01/28/2015 which showed EF 60-65%, no regional wall motion abnormality. She underwent cardiac catheterization on the same day which showed normal coronaries, normal EF, normal LVEDP. During the cardiac catheterization, she was noted to have very irritable left ventricle with easily inducible tachyarrhythmia which appears to be SVT and this replicates her symptom. It was felt that elevated troponin is likely related to tachyarrhythmia induced by caffeinated drinks. She was placed on low-dose beta blocker. We were unable to up titrate the beta blocker dose due to her blood pressure.  She was seen by Dr. 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 She is deemed stable for discharge from on low-dose metoprolol. Per Dr.  Graciela Husbands, will arrange follow up with Dr. Ladona Ridgel so a relationship can be established which might allow to facilitate future discussion regarding catheter ablation.   Discharge Vitals Blood pressure 117/61, pulse 67, temperature 98.1 F (36.7 C), temperature source Oral, resp. rate 16, height 5\' 6"  (1.676 m), weight 136 lb 9.6 oz (61.961 kg), last menstrual period 04/29/2013, SpO2 100 %.  Filed Weights   01/28/15 0137 01/28/15 0536 01/29/15 0539  Weight: 138 lb (62.596 kg) 134 lb 1.6 oz (60.827 kg) 136 lb 9.6 oz (61.961 kg)    Labs  CBC  Recent Labs  01/28/15 0141 01/28/15 0628 01/29/15 0320  WBC 4.6 5.7 3.9*  NEUTROABS 1.2*  --   --   HGB 12.7 12.2 11.4*  HCT 38.2 37.7 35.4*  MCV 85.7 85.7 85.9  PLT 198 165 169   Basic Metabolic Panel  Recent Labs  01/28/15 0141 01/28/15 0628  NA 140 142  K 3.3* 3.7  CL 106 107  CO2 25 27  GLUCOSE 152* 106*  BUN 22* 21*  CREATININE 1.08* 0.94  CALCIUM 9.2 9.3  MG  --  1.8   Cardiac Enzymes  Recent Labs  01/28/15 0628 01/28/15 1138 01/28/15 1758  TROPONINI 0.38* 0.41* 0.41*   D-Dimer  Recent Labs  01/28/15 0555  DDIMER <0.27   Hemoglobin A1C  Recent Labs  01/28/15 0628  HGBA1C 5.8*   Fasting Lipid Panel  Recent Labs  01/28/15 0628  CHOL 228*  HDL 73  LDLCALC 142*  TRIG 63  CHOLHDL 3.1   Thyroid Function Tests  Recent Labs  01/28/15 0628  TSH 2.183    Disposition  Pt is being discharged home today in good condition.  Follow-up Plans & Appointments      Follow-up Information    Follow up with Lewayne Bunting, MD.   Specialty:  Cardiology   Why:  Office will contact you to arrange outpatient followup with Dr. Rosine Beat information:   [address] [place] [address] [phone]       Discharge  Medications    Medication List    TAKE these medications        atorvastatin 80 MG tablet  Commonly known as:  LIPITOR  Take 1 tablet (80 mg total) by mouth daily at 6 PM.     escitalopram 10 MG tablet  Commonly known as:  LEXAPRO  Take 10 mg by mouth daily.     metoprolol tartrate 25 MG tablet  Commonly known as:  LOPRESSOR  Take 0.5 tablets (12.5 mg total) by mouth 2 (two) times daily.         Duration of Discharge Encounter   Greater than 30 minutes including physician time.  Ramond Dial PA-C Pager: [phone] 01/29/2015, 1:15 PM

## 2015-01-29 NOTE — Discharge Instructions (Signed)
Avoid caffeinated drink. Contact cardiology if has recurrent symptom despite compliance with metoprolol.

## 2015-01-29 NOTE — Progress Notes (Signed)
CARDIAC REHAB PHASE I   PRE:  Rate/Rhythm: 68 dinus  BP:  Supine:   Sitting:   Standing: 124/64     MODE:  Ambulation: 550 ft   POST:  Rate/Rhythem: 74  BP:  Supine:   Sitting:   Standing: 124/64    Pt ambulated 550 ft with assist x1.  Pt tolerated walk well without c/o of chest pain or palpitations.  Reviewed MI booklet with pt, along with diet and exercise.  Pt admitted to a heavy caffeine intake and was encouraged to cut back as much as possible given palpitations.  Also reviewed Cardiac Rehab Phase II with pt info to be sent to [facility], [facility], [facility].  Pt voiced understanding and is eager to get home. [name], MA, ACSM RCEP [phone]  Hazle Nordmann

## 2015-01-29 NOTE — Consult Note (Signed)
ELECTROPHYSIOLOGY CONSULT NOTE  Patient ID: Leslie Summers, MRN: [id number], DOB/AGE: Jul 09, 1966 49 y.o. Admit date: 01/28/2015 Date of Consult: 01/29/2015  Primary Physician: No PCP Per Patient Primary Cardiologist: new  Chief Complaint: SVT    HPI Leslie Summers is a 49 y.o. female presented with palpitations chest pain and a positive troponin (0.4) Catheterization demonstrated no obstructive coronary disease but she had apparently easily inducible tachycardia felt to be SVT and reproducing her symptoms. These were frog negative and diaphoretic negative. At home they were associated with chest discomfort and lightheadedness.  She has no prior history of tachypalpitations. These occurred in the context of having had a [facility] which is unusual for her. She does drink coffee.  There is no family history of sudden death. She has no visual disturbances. She is not anomalously tall for her family.     History reviewed. No pertinent past medical history.    Surgical History:  Past Surgical History  Procedure Laterality Date  . Uterine fibroid embolization       Home Meds: Prior to Admission medications   Medication Sig Start Date End Date Taking? Authorizing Provider  escitalopram (LEXAPRO) 10 MG tablet Take 10 mg by mouth daily.   Yes Historical Provider, MD    Inpatient Medications:  . aspirin EC  81 mg Oral Daily  . atorvastatin  80 mg Oral q1800  . escitalopram  10 mg Oral Daily  . metoprolol tartrate  12.5 mg Oral BID  . sodium chloride  3 mL Intravenous Q12H      Allergies: No Known Allergies  History   Social History  . Marital Status: Married    Spouse Name: N/A  . Number of Children: N/A  . Years of Education: N/A   Occupational History  . Not on file.   Social History Main Topics  . Smoking status: Never Smoker   . Smokeless tobacco: Never Used  . Alcohol Use: Yes     Comment: occ  . Drug Use: No  . Sexual Activity: Yes    Birth Control/  Protection: None   Other Topics Concern  . Not on file   Social History Narrative     Family History  Problem Relation Age of Onset  . Diabetes Mother      ROS:  Please see the history of present illness.     All other systems reviewed and negative.    Physical Exam: Blood pressure 117/61, pulse 67, temperature 98.1 F (36.7 C), temperature source Oral, resp. rate 16, height  (1.676 m), weight 136 lb 9.6 oz (61.961 kg), last menstrual period 04/29/2013, SpO2 100 %. General: Well developed, well nourished female in no acute distress. Head: Normocephalic, atraumatic, sclera non-icteric, no xanthomas, nares are without discharge. EENT: normal Lymph Nodes:  none Back: without scoliosis/kyphosis, no CVA tendersness Neck: Negative for carotid bruits. JVD not elevated. Lungs: Clear bilaterally to auscultation without wheezes, rales, or rhonchi. Breathing is unlabored. Heart: RRR with S1 S2. No murmur , rubs, or gallops appreciated. Abdomen: Soft, non-tender, non-distended with normoactive bowel sounds. No hepatomegaly. No rebound/guarding. No obvious abdominal masses. Msk:  Strength and tone appear normal for age. Extremities: No clubbing or cyanosis. No edema.  Distal pedal pulses are 2+ and equal bilaterally.   arachnodactyly with a thumb finger sign; no significan0ly abnormal joint flexibility Skin: Warm and Dry Neuro: Alert and oriented X 3. CN III-XII intact Grossly normal sensory and motor function . Psych:  Responds to questions appropriately  with a normal affect.      Labs: Cardiac Enzymes  Recent Labs  01/28/15 0628 01/28/15 1138 01/28/15 1758  TROPONINI 0.38* 0.41* 0.41*   CBC Lab Results  Component Value Date   WBC 3.9* 01/29/2015   HGB 11.4* 01/29/2015   HCT 35.4* 01/29/2015   MCV 85.9 01/29/2015   PLT 169 01/29/2015   PROTIME:  Recent Labs  01/28/15 0555  LABPROT 14.3  INR 1.09   Chemistry  Recent Labs Lab 01/28/15 0628  NA 142  K 3.7    CL 107  CO2 27  BUN 21*  CREATININE 0.94  CALCIUM 9.3  GLUCOSE 106*   Lipids Lab Results  Component Value Date   CHOL 228* 01/28/2015   HDL 73 01/28/2015   LDLCALC 142* 01/28/2015   TRIG 63 01/28/2015   BNP No results found for: PROBNP Thyroid Function Tests:  Recent Labs  01/28/15 0628  TSH 2.183      Miscellaneous Lab Results  Component Value Date   DDIMER <0.27 01/28/2015    Radiology/Studies:  Dg Chest 2 View  01/28/2015   CLINICAL DATA:  Central chest pain, onset today.  Palpitations.  EXAM: CHEST  2 VIEW  COMPARISON:  None.  FINDINGS: Hyperinflation. Scarring in the lung apices, likely postinflammatory. Suggestion of mild central bronchiectasis. Normal heart size and pulmonary vascularity. No focal airspace disease or consolidation in the lungs. No blunting of costophrenic angles. No pneumothorax. Mediastinal contours appear intact.  IMPRESSION: Hyperinflation. Postinflammatory scarring in the lung apices. Suggestion of mild central bronchiectasis. No evidence of active consolidation.   Electronically Signed   By: Burman Nieves M.D.   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 Elissa Hefty Lap description given her symptoms and the context in which it occurred, probably the catheter ventricle, likely AV reentry.  Given the frequency of events the other day, I think it is reasonable for the first month or so to keep her low-dose metoprolol, up titration be limited by her blood pressure.  He'll have follow-up with Dr. Ladona Ridgel so that a relation can be established that might allow for facilitated catheter ablation.     Sherryl Manges

## 2015-01-31 ENCOUNTER — Encounter (HOSPITAL_COMMUNITY): Payer: Self-pay | Admitting: Cardiology

## 2015-01-31 MED FILL — Lidocaine HCl Local Preservative Free (PF) Inj 1%: INTRAMUSCULAR | Qty: 30 | Status: AC

## 2015-01-31 MED FILL — Nitroglycerin IV Soln 100 MCG/ML in D5W: INTRA_ARTERIAL | Qty: 10 | Status: AC

## 2015-01-31 MED FILL — Heparin Sodium (Porcine) 2 Unit/ML in Sodium Chloride 0.9%: INTRAMUSCULAR | Qty: 1500 | Status: AC

## 2015-02-24 ENCOUNTER — Telehealth: Payer: Self-pay | Admitting: *Deleted

## 2015-02-24 NOTE — Telephone Encounter (Signed)
UNABLE TO REACH PT TO GET FAMILY HX/STATUS. 

## 2015-03-02 ENCOUNTER — Encounter: Payer: Self-pay | Admitting: Internal Medicine

## 2015-03-02 ENCOUNTER — Ambulatory Visit (INDEPENDENT_AMBULATORY_CARE_PROVIDER_SITE_OTHER): Payer: Managed Care, Other (non HMO) | Admitting: Internal Medicine

## 2015-03-02 VITALS — BP 120/80 | HR 80 | Ht 66.5 in | Wt 138.0 lb

## 2015-03-02 DIAGNOSIS — I471 Supraventricular tachycardia: Secondary | ICD-10-CM

## 2015-03-02 DIAGNOSIS — I214 Non-ST elevation (NSTEMI) myocardial infarction: Secondary | ICD-10-CM | POA: Diagnosis not present

## 2015-03-02 NOTE — Patient Instructions (Signed)
Medication Instructions:   Your physician recommends that you continue on your current medications as directed. Please refer to the Current Medication list given to you today.    Labwork:   Testing/Procedures:   Follow-Up:  Your physician wants you to follow-up in: 6 MONTHS WITH  DR TAYLOR You will receive a reminder letter in the mail two months in advance. If you don't receive a letter, please call our office to schedule the follow-up appointment.    Any Other Special Instructions Will Be Listed Below (If Applicable).   

## 2015-03-02 NOTE — Progress Notes (Signed)
      HPI [name] is referred today for evaluation of SVT. She is a very pleasant 49 year old woman with a history of palpitations in the past, typically short-lived. A month ago, she had approximately 5 hours of palpitations and chest pressure and presented to the emergency room where she was actually back in sinus rhythm. She had a slight elevation in her troponin level, and underwent catheterization which demonstrated normal left ventricular function and no coronary disease. The patient during her procedure had recurrent episodes of SVT caused by catheter manipulation. The patient has been placed on metoprolol and was intolerant of her medications. She has discontinued metoprolol. She was also started on a statin and has stopped this as well. Since discharge from the hospital, she has cut back on her caffeine intake and overall she is improved. She has had minimal palpitations, none of them sustained.  No Known Allergies   Current Outpatient Prescriptions  Medication Sig Dispense Refill  . escitalopram (LEXAPRO) 10 MG tablet Take 10 mg by mouth daily.     No current facility-administered medications for this visit.     Past Medical History  Diagnosis Date  . SVT (supraventricular tachycardia)     cath normal coronaries 01/28/2015, easily induceble SVT during cath from irritated LV   . Hyperlipidemia 01/28/2015    ROS:   All systems reviewed and negative except as noted in the HPI.   Past Surgical History  Procedure Laterality Date  . Uterine fibroid embolization    . Cardiac catheterization N/A 01/28/2015    Procedure: Left Heart Cath and Coronary Angiography;  Surgeon: Marykay Lexavid W Harding, MD;  Location: [facility] INV[facility]IVE CV [facility];  Service: Cardiovascular;  Laterality: N/A;     Family History  Problem Relation Age of Onset  . Diabetes Mother   . Heart attack Father     mid 3750      History   Social History  . Marital Status: Married    Spouse Name: N/A  . Number of  Children: N/A  . Years of Education: N/A   Occupational History  . Not on file.   Social History Main Topics  . Smoking status: Never Smoker   . Smokeless tobacco: Never Used  . Alcohol Use: Yes     Comment: occ  . Drug Use: No  . Sexual Activity: Yes    Birth Control/ Protection: None   Other Topics Concern  . Not on file   Social History Narrative     BP 120/80 mmHg  Pulse 80  Ht 5' 6.5" (1.689 m)  Wt 138 lb (62.596 kg)  BMI 21.94 kg/m2  Physical Exam:  Well appearing [age] year old woman, looks younger than her stated age, NAD HEENT: Unremarkable Neck:  No JVD, no thyromegally Back:  No CVA tenderness Lungs:  Clear with no wheezes, rales, or rhonchi. HEART:  Regular rate rhythm, no murmurs, no rubs, no clicks Abd:  soft, positive bowel sounds, no organomegally, no rebound, no guarding Ext:  2 plus pulses, no edema, no cyanosis, no clubbing Skin:  No rashes no nodules Neuro:  CN II through XII intact, motor grossly intact  EKG - reviewed - normal sinus rhythm, no ventricular preexcitation  Assess/Plan:

## 2015-03-02 NOTE — Assessment & Plan Note (Signed)
Today we discussed the treatment options in detail. She has done well with regard to her SVT. I've recommended that she undergo watchful waiting. I encouraged the patient to avoid caffeine. If she has recurrent SVT, I would suggest that she undergo catheter ablation. The risk, goals, benefits, and expectations of the procedure were discussed in detail. She will call us if her episodes return.

## 2015-03-02 NOTE — Assessment & Plan Note (Signed)
By catheterization, she has no obstructive coronary disease. Her troponin elevation was a consequence of subendocardial ischemia secondary to increased demand.

## 2016-04-09 ENCOUNTER — Other Ambulatory Visit: Payer: Self-pay | Admitting: Obstetrics and Gynecology

## 2016-04-09 DIAGNOSIS — R928 Other abnormal and inconclusive findings on diagnostic imaging of breast: Secondary | ICD-10-CM

## 2016-04-23 ENCOUNTER — Other Ambulatory Visit: Payer: Self-pay | Admitting: Obstetrics and Gynecology

## 2016-04-23 ENCOUNTER — Ambulatory Visit
Admission: RE | Admit: 2016-04-23 | Discharge: 2016-04-23 | Disposition: A | Discharge status: Home / Self Care | Payer: Managed Care, Other (non HMO) | Source: Ambulatory Visit | Attending: Obstetrics and Gynecology | Admitting: Obstetrics and Gynecology

## 2016-04-23 DIAGNOSIS — R928 Other abnormal and inconclusive findings on diagnostic imaging of breast: Secondary | ICD-10-CM

## 2016-04-24 ENCOUNTER — Other Ambulatory Visit: Payer: Self-pay | Admitting: Obstetrics and Gynecology

## 2016-04-24 DIAGNOSIS — R928 Other abnormal and inconclusive findings on diagnostic imaging of breast: Secondary | ICD-10-CM

## 2016-04-25 ENCOUNTER — Ambulatory Visit
Admission: RE | Admit: 2016-04-25 | Discharge: 2016-04-25 | Disposition: A | Discharge status: Home / Self Care | Payer: Managed Care, Other (non HMO) | Source: Ambulatory Visit | Attending: Obstetrics and Gynecology | Admitting: Obstetrics and Gynecology

## 2016-04-25 DIAGNOSIS — R928 Other abnormal and inconclusive findings on diagnostic imaging of breast: Secondary | ICD-10-CM

## 2019-09-25 DIAGNOSIS — L821 Other seborrheic keratosis: Secondary | ICD-10-CM | POA: Diagnosis not present

## 2019-09-25 DIAGNOSIS — D485 Neoplasm of uncertain behavior of skin: Secondary | ICD-10-CM | POA: Diagnosis not present

## 2020-01-04 DIAGNOSIS — Z1151 Encounter for screening for human papillomavirus (HPV): Secondary | ICD-10-CM | POA: Diagnosis not present

## 2020-01-04 DIAGNOSIS — Z1231 Encounter for screening mammogram for malignant neoplasm of breast: Secondary | ICD-10-CM | POA: Diagnosis not present

## 2020-01-04 DIAGNOSIS — Z6823 Body mass index (BMI) 23.0-23.9, adult: Secondary | ICD-10-CM | POA: Diagnosis not present

## 2020-01-04 DIAGNOSIS — Z01419 Encounter for gynecological examination (general) (routine) without abnormal findings: Secondary | ICD-10-CM | POA: Diagnosis not present

## 2020-01-13 DIAGNOSIS — Z130 Encounter for screening for diseases of the blood and blood-forming organs and certain disorders involving the immune mechanism: Secondary | ICD-10-CM | POA: Diagnosis not present

## 2020-01-13 DIAGNOSIS — Z0000 Encounter for general adult medical examination without abnormal findings: Secondary | ICD-10-CM | POA: Diagnosis not present

## 2020-01-13 DIAGNOSIS — E049 Nontoxic goiter, unspecified: Secondary | ICD-10-CM | POA: Diagnosis not present

## 2020-01-13 DIAGNOSIS — Z131 Encounter for screening for diabetes mellitus: Secondary | ICD-10-CM | POA: Diagnosis not present

## 2020-01-13 DIAGNOSIS — Z1329 Encounter for screening for other suspected endocrine disorder: Secondary | ICD-10-CM | POA: Diagnosis not present

## 2020-01-28 ENCOUNTER — Other Ambulatory Visit: Payer: Self-pay | Admitting: Obstetrics and Gynecology

## 2020-01-28 DIAGNOSIS — E049 Nontoxic goiter, unspecified: Secondary | ICD-10-CM

## 2020-02-03 ENCOUNTER — Ambulatory Visit
Admission: RE | Admit: 2020-02-03 | Discharge: 2020-02-03 | Disposition: A | Discharge status: Home / Self Care | Payer: BC Managed Care – PPO | Source: Ambulatory Visit | Attending: Obstetrics and Gynecology | Admitting: Obstetrics and Gynecology

## 2020-02-03 DIAGNOSIS — E049 Nontoxic goiter, unspecified: Secondary | ICD-10-CM

## 2020-02-03 DIAGNOSIS — E042 Nontoxic multinodular goiter: Secondary | ICD-10-CM | POA: Diagnosis not present

## 2020-02-22 ENCOUNTER — Other Ambulatory Visit: Payer: Self-pay | Admitting: Endocrinology

## 2020-02-22 DIAGNOSIS — E049 Nontoxic goiter, unspecified: Secondary | ICD-10-CM | POA: Diagnosis not present

## 2020-02-22 DIAGNOSIS — Z808 Family history of malignant neoplasm of other organs or systems: Secondary | ICD-10-CM | POA: Diagnosis not present

## 2020-08-17 DIAGNOSIS — E049 Nontoxic goiter, unspecified: Secondary | ICD-10-CM | POA: Diagnosis not present

## 2020-08-24 DIAGNOSIS — Z808 Family history of malignant neoplasm of other organs or systems: Secondary | ICD-10-CM | POA: Diagnosis not present

## 2020-08-24 DIAGNOSIS — E049 Nontoxic goiter, unspecified: Secondary | ICD-10-CM | POA: Diagnosis not present

## 2020-08-31 ENCOUNTER — Other Ambulatory Visit: Payer: Self-pay | Admitting: Endocrinology

## 2020-08-31 DIAGNOSIS — E049 Nontoxic goiter, unspecified: Secondary | ICD-10-CM

## 2021-01-31 DIAGNOSIS — Z01419 Encounter for gynecological examination (general) (routine) without abnormal findings: Secondary | ICD-10-CM | POA: Diagnosis not present

## 2021-01-31 DIAGNOSIS — Z1231 Encounter for screening mammogram for malignant neoplasm of breast: Secondary | ICD-10-CM | POA: Diagnosis not present

## 2021-01-31 DIAGNOSIS — D259 Leiomyoma of uterus, unspecified: Secondary | ICD-10-CM | POA: Diagnosis not present

## 2021-01-31 DIAGNOSIS — Z780 Asymptomatic menopausal state: Secondary | ICD-10-CM | POA: Diagnosis not present

## 2021-03-24 IMAGING — US US THYROID
1 series · 13 of 25 positions shown · non-contrast
Comparison: None.

CLINICAL DATA: Palpable abnormality. Thyromegaly on physical
examination.

EXAM:
THYROID ULTRASOUND
TECHNIQUE: Ultrasound examination of the thyroid gland and adjacent soft
tissues was performed.

[Series 1: us thyroid · 0.07mm/px · 13 of 87 slices shown]
[im 1/87]
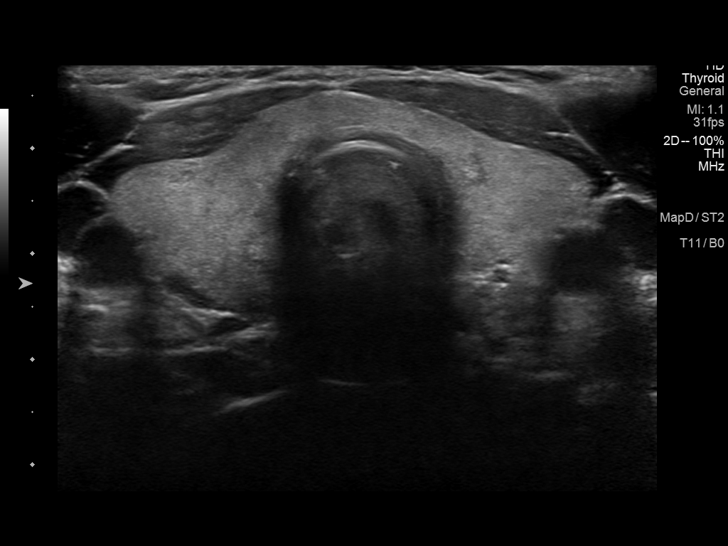
[im 8/87]
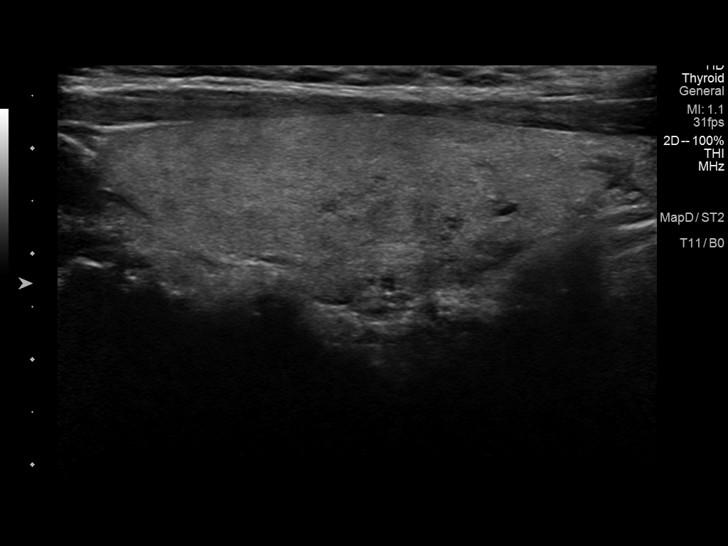
[im 15/87]
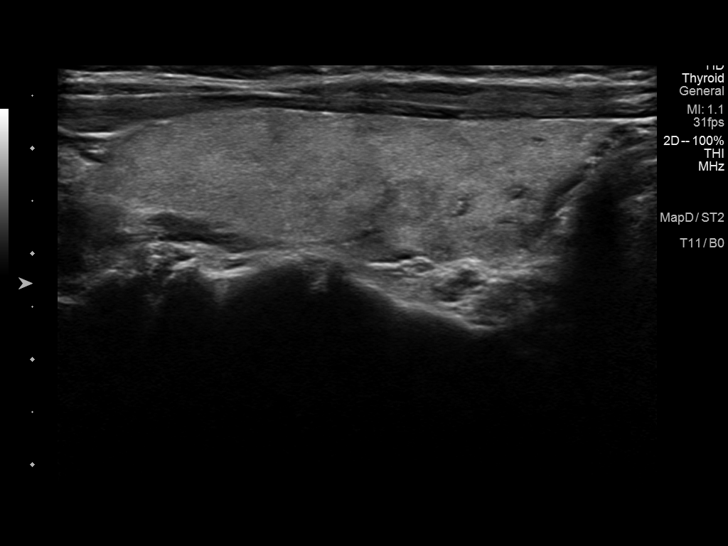
[im 22/87]
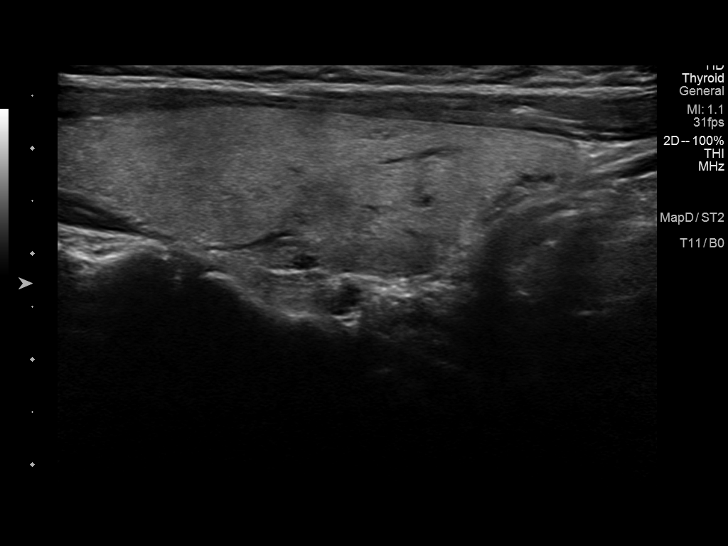
[im 29/87]
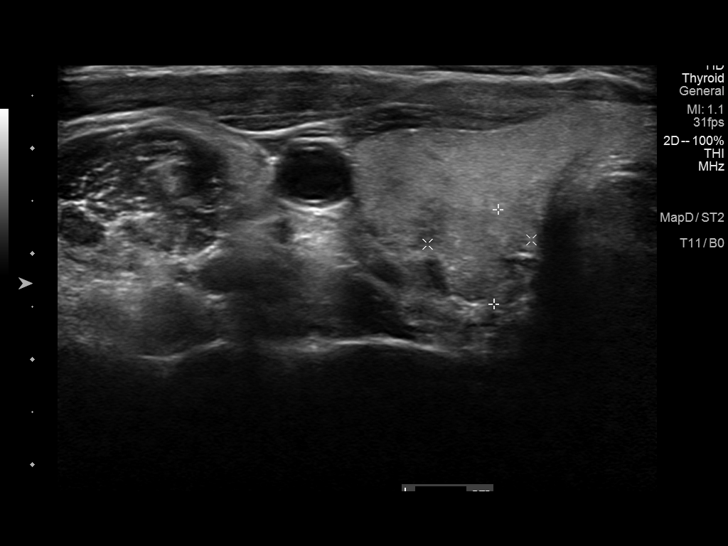
[im 36/87]
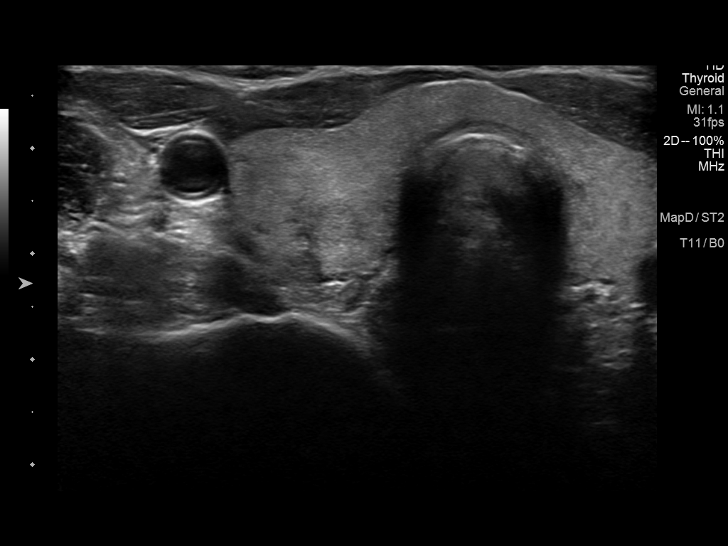
[im 44/87]
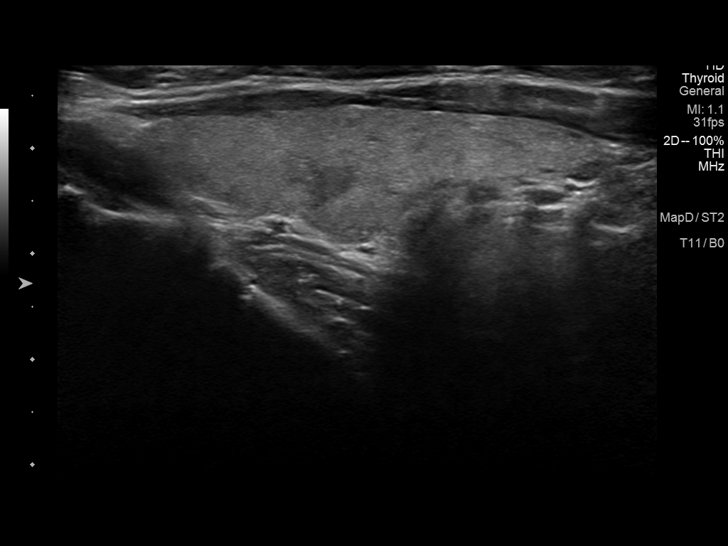
[im 51/87]
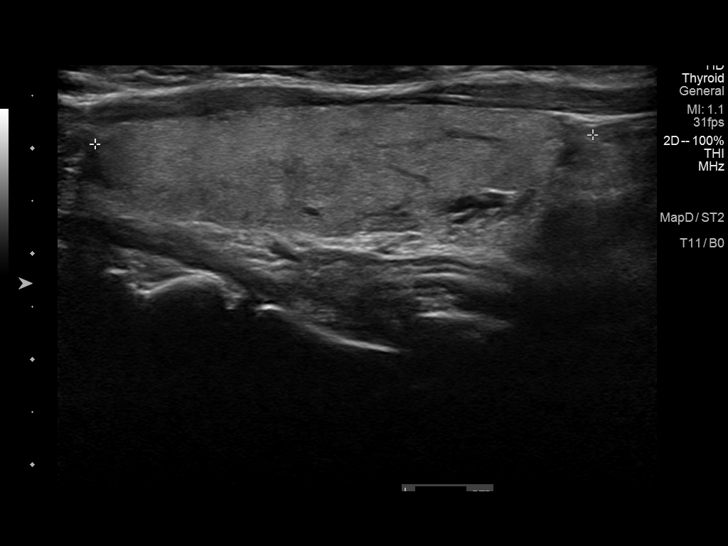
[im 58/87]
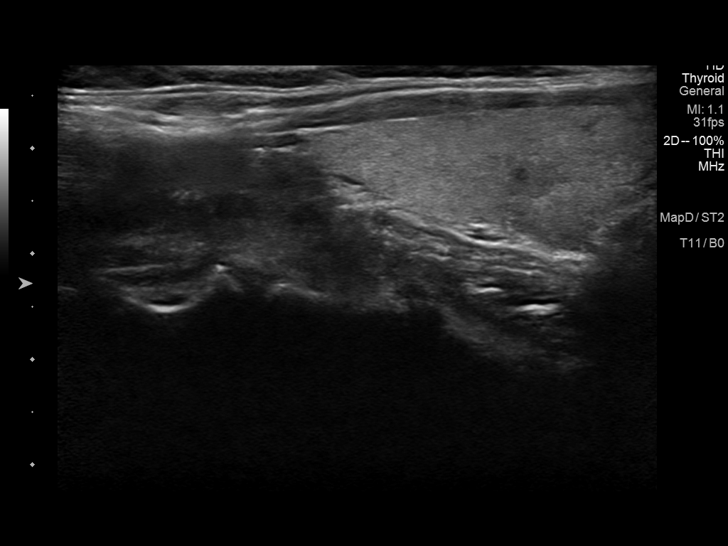
[im 65/87]
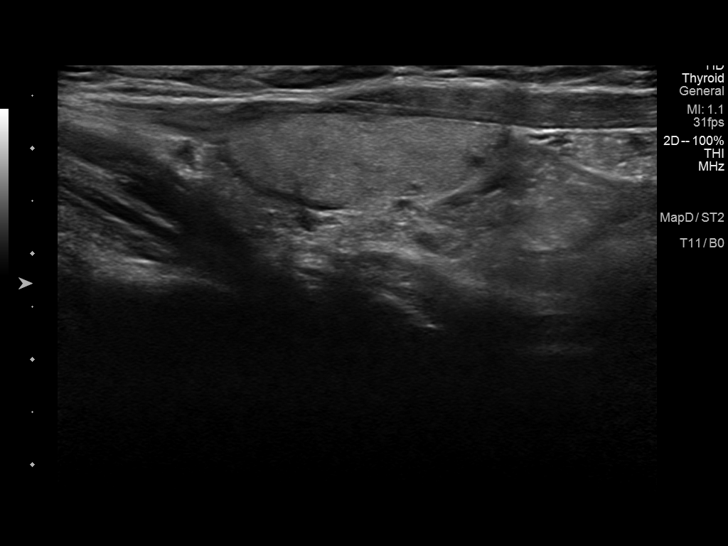
[im 72/87]
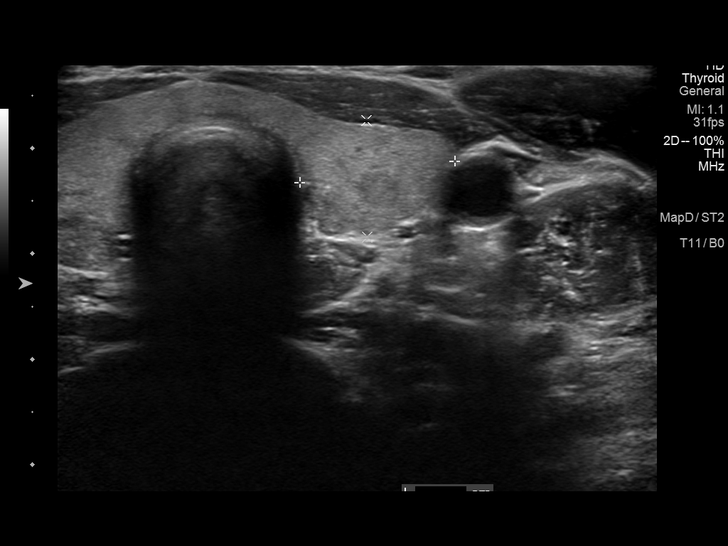
[im 79/87]
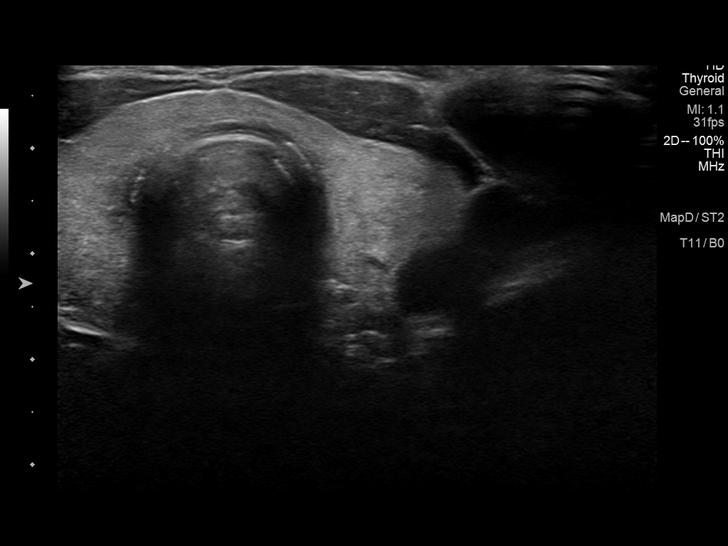
[im 87/87]
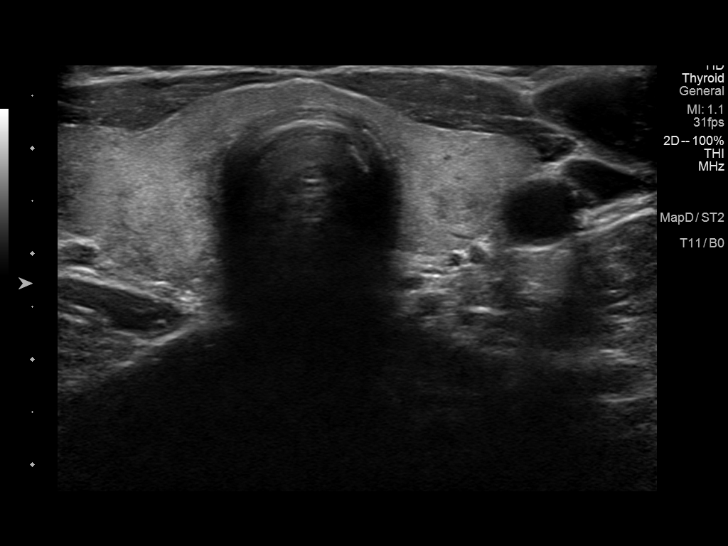

[13 of 25 positions shown; findings below may reference images not displayed]

FINDINGS: Parenchymal Echotexture: Mildly heterogenous - potential mild
diffuse glandular hyperemia (images 11, 55 and 56).

Isthmus: Normal in size measures 0.4 cm in diameter

Right lobe: Borderline enlarged measuring 5.7 x 1.6 x 1.8 cm

Left lobe: Borderline enlarged measuring 5.1 x 1.3 x 1.5 cm

_________________________________________________________

Estimated total number of nodules >/= 1 cm: 1

Number of spongiform nodules >/=  2 cm not described below (TR1): 0

Number of mixed cystic and solid nodules >/= 1.5 cm not described
below (TR2): 0

_________________________________________________________

Questioned 1.0 x 1.0 x 0.9 cm isoechoic ill-defined nodule within
the mid, posterior aspect of the right lobe of the thyroid (labeled
1), is favored to represent a pseudonodule as it lacks defined
borders on both provided transverse and sagittal images.

Questioned 0.9 x 0.8 x 0.4 cm hypoechoic ill-defined nodule
involving the mid, posterior aspect of the right lobe of the thyroid
(labeled 2), is favored to represent a pseudonodule as it lacks
defined borders on both provided transverse and sagittal images.
IMPRESSION: Borderline enlarged, mildly heterogeneous and potentially hyperemic
thyroid gland without discrete worrisome thyroid nodule or mass.
Findings are nonspecific though could be seen in the setting of a
thyroiditis. Clinical correlation is advised.

## 2021-08-17 DIAGNOSIS — E049 Nontoxic goiter, unspecified: Secondary | ICD-10-CM | POA: Diagnosis not present

## 2021-08-17 DIAGNOSIS — E119 Type 2 diabetes mellitus without complications: Secondary | ICD-10-CM | POA: Diagnosis not present

## 2021-08-24 DIAGNOSIS — E049 Nontoxic goiter, unspecified: Secondary | ICD-10-CM | POA: Diagnosis not present

## 2021-08-24 DIAGNOSIS — Z808 Family history of malignant neoplasm of other organs or systems: Secondary | ICD-10-CM | POA: Diagnosis not present

## 2021-08-25 ENCOUNTER — Other Ambulatory Visit: Payer: Self-pay | Admitting: Endocrinology

## 2021-08-25 DIAGNOSIS — E049 Nontoxic goiter, unspecified: Secondary | ICD-10-CM

## 2021-09-01 ENCOUNTER — Ambulatory Visit
Admission: RE | Admit: 2021-09-01 | Discharge: 2021-09-01 | Disposition: A | Discharge status: Home / Self Care | Payer: BC Managed Care – PPO | Source: Ambulatory Visit | Attending: Endocrinology | Admitting: Endocrinology

## 2021-09-01 DIAGNOSIS — E049 Nontoxic goiter, unspecified: Secondary | ICD-10-CM | POA: Diagnosis not present

## 2022-02-06 DIAGNOSIS — Z01419 Encounter for gynecological examination (general) (routine) without abnormal findings: Secondary | ICD-10-CM | POA: Diagnosis not present

## 2022-02-06 DIAGNOSIS — E049 Nontoxic goiter, unspecified: Secondary | ICD-10-CM | POA: Diagnosis not present

## 2022-02-06 DIAGNOSIS — Z1231 Encounter for screening mammogram for malignant neoplasm of breast: Secondary | ICD-10-CM | POA: Diagnosis not present

## 2022-02-06 DIAGNOSIS — N952 Postmenopausal atrophic vaginitis: Secondary | ICD-10-CM | POA: Diagnosis not present

## 2022-02-06 DIAGNOSIS — Z780 Asymptomatic menopausal state: Secondary | ICD-10-CM | POA: Diagnosis not present

## 2022-05-03 DIAGNOSIS — Z1211 Encounter for screening for malignant neoplasm of colon: Secondary | ICD-10-CM | POA: Diagnosis not present

## 2022-05-03 DIAGNOSIS — Z86010 Personal history of colonic polyps: Secondary | ICD-10-CM | POA: Diagnosis not present

## 2022-06-27 DIAGNOSIS — Z1211 Encounter for screening for malignant neoplasm of colon: Secondary | ICD-10-CM | POA: Diagnosis not present

## 2022-10-21 IMAGING — US US THYROID
2 of 3 series · 13 of 25 positions shown · non-contrast
Comparison: None.

CLINICAL DATA: Goiter.

EXAM:
THYROID ULTRASOUND
TECHNIQUE: Ultrasound examination of the thyroid gland and adjacent soft
tissues was performed.

[Series 1: us thyroid · 0.06mm/px · 12 of 43 slices shown (1 of 2)]
[im 1/43]
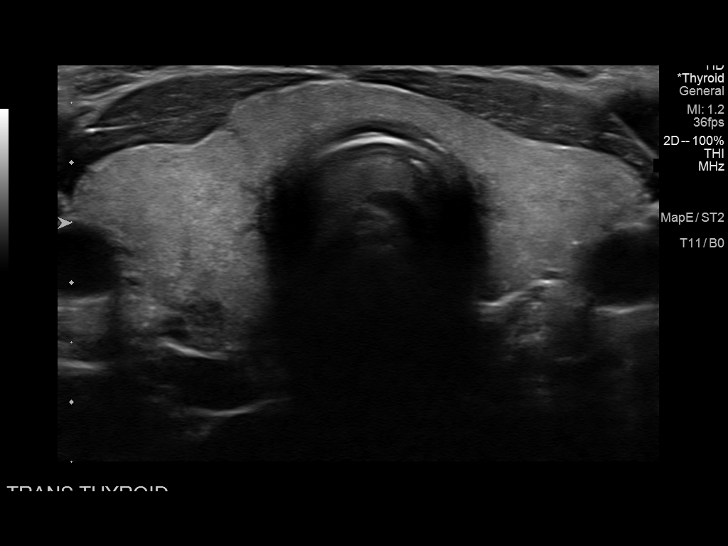
[im 4/43]
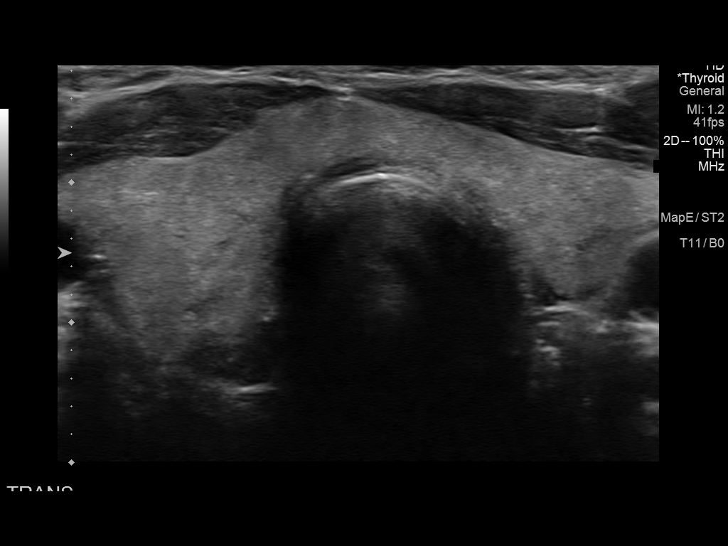
[im 8/43]
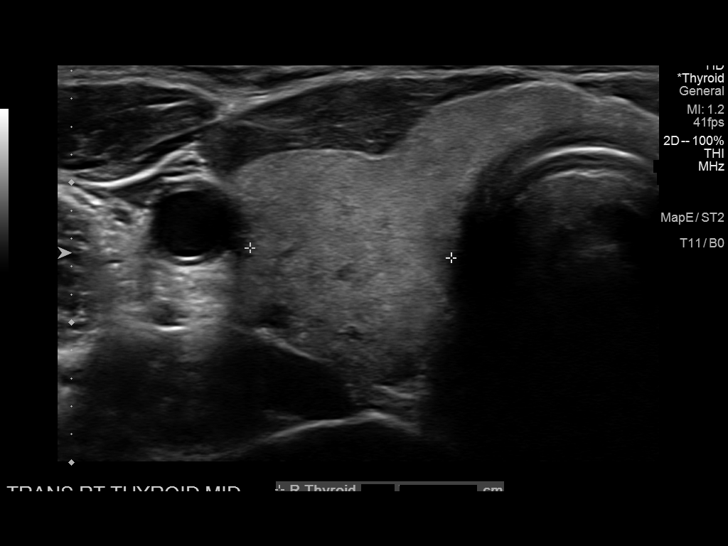
[im 12/43]
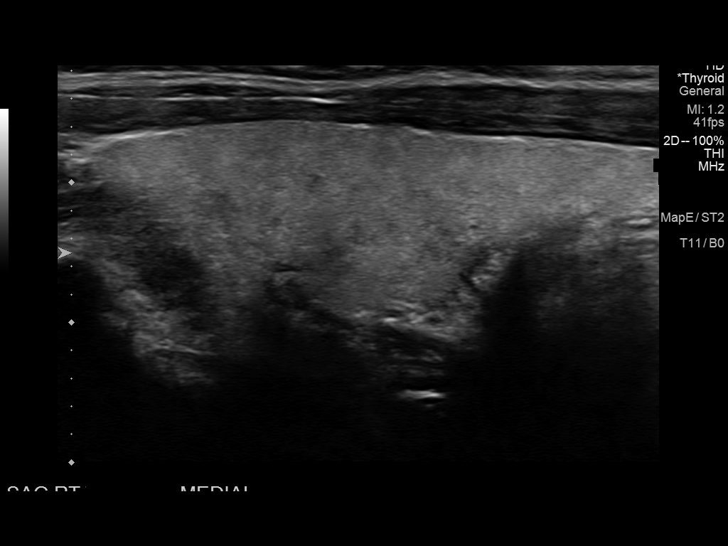
[im 16/43]
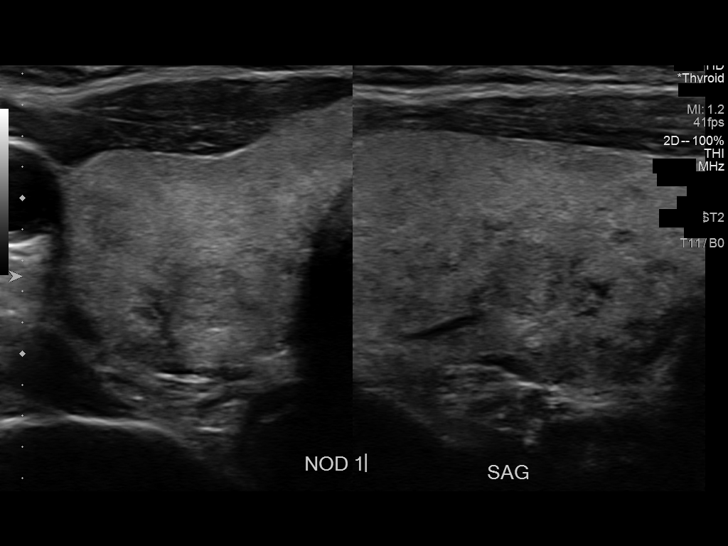
[im 20/43]
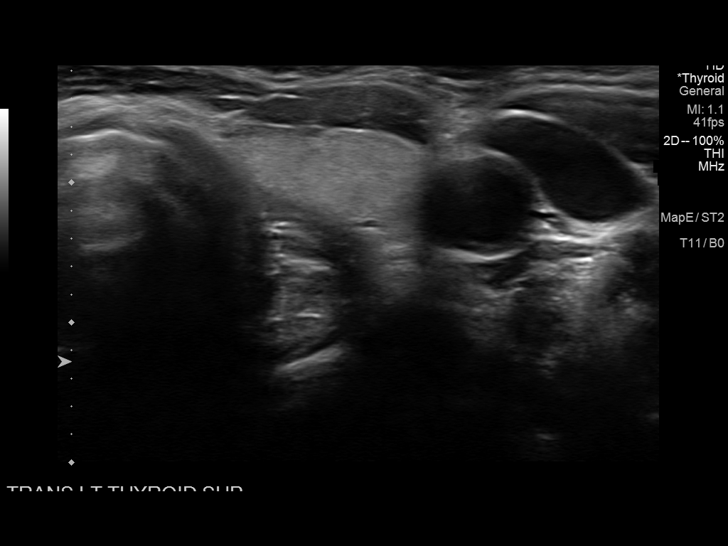
[im 23/43]
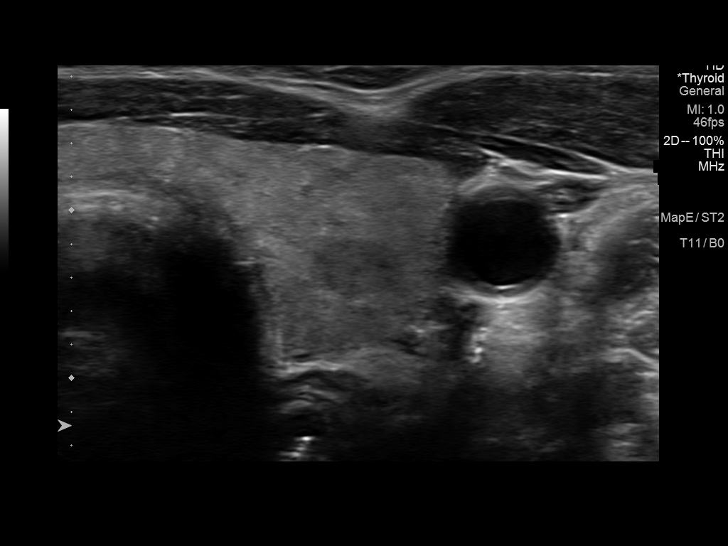
[im 27/43]
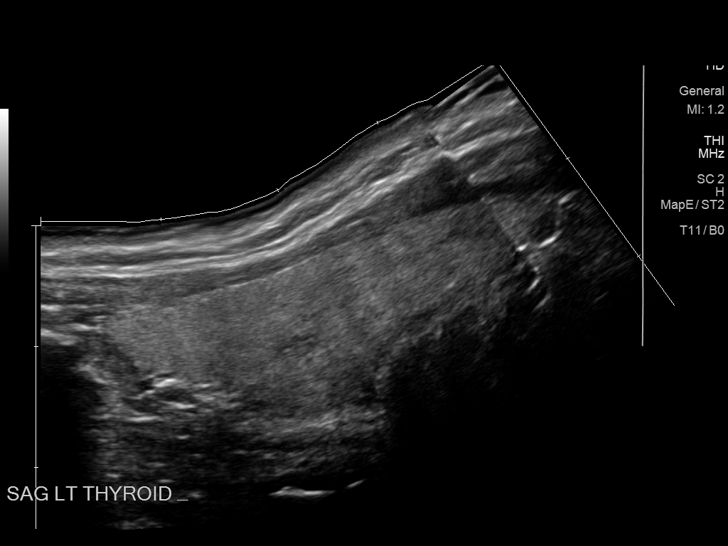
[im 31/43]
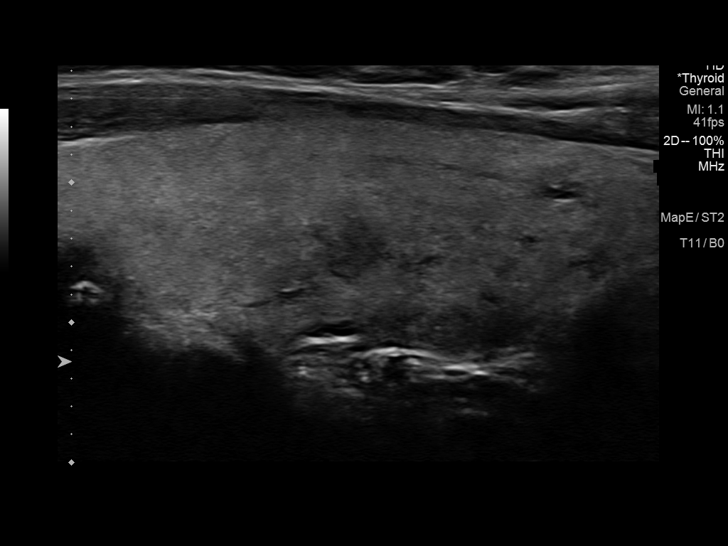
[im 35/43]
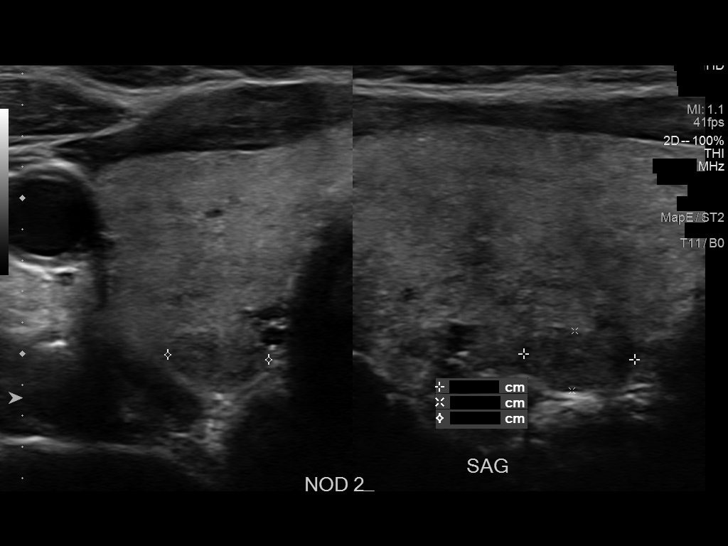
[im 39/43]
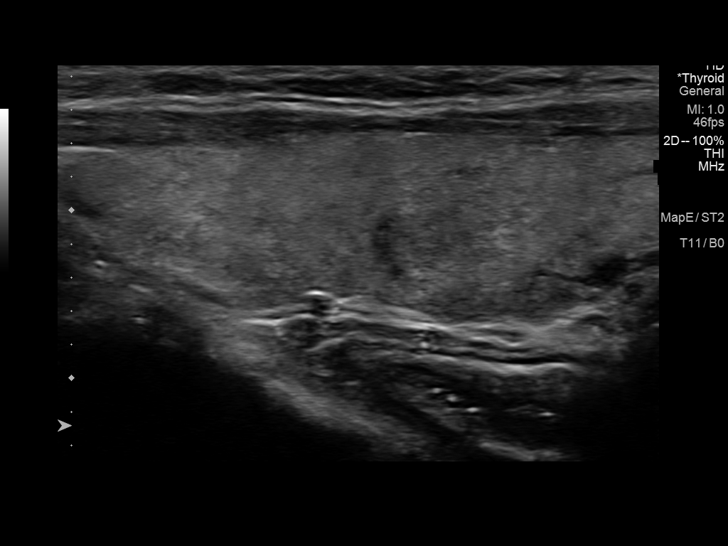
[im 43/43]
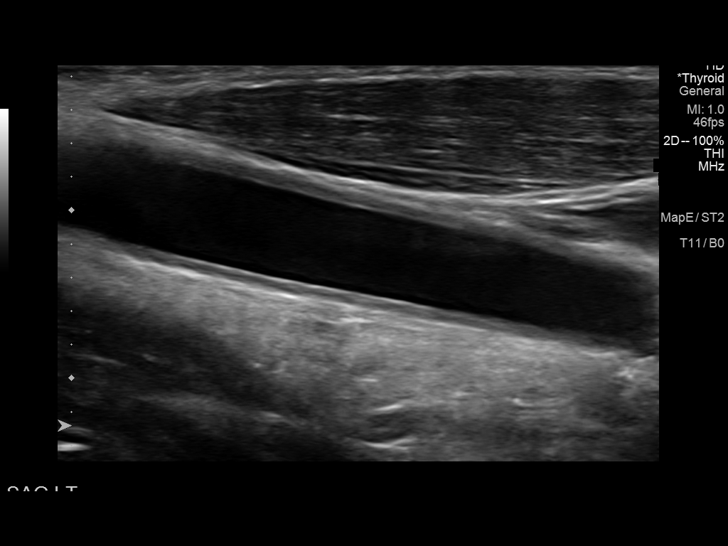

[Series 2002: us thyroid · 0.06mm/px · 1 of 1 slices shown (2 of 2)]
[im 1/1]
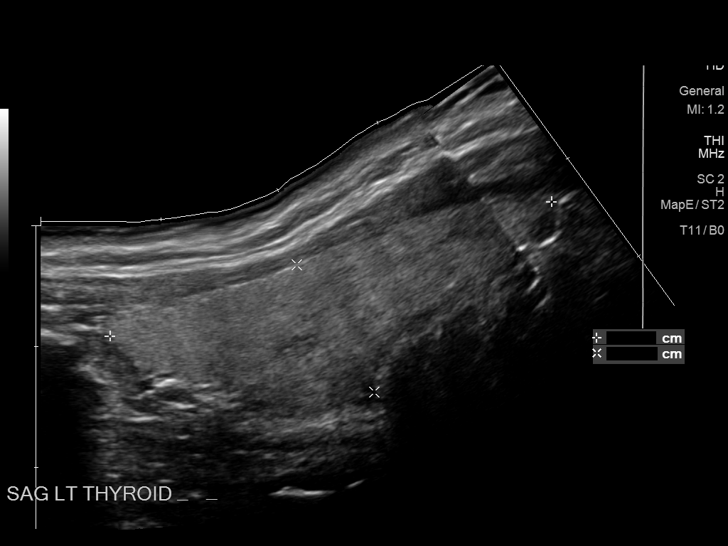

[13 of 25 positions shown; findings below may reference images not displayed]

FINDINGS: Parenchymal Echotexture: Mildly heterogenous

Isthmus: 0.5 cm

Right lobe: 4.2 x 1.4 x 1.4 cm

Left lobe: 3.8 x 1.2 x 1.2 cm

_________________________________________________________

Estimated total number of nodules >/= 1 cm: 0

Number of spongiform nodules >/=  2 cm not described below (TR1): 0

Number of mixed cystic and solid nodules >/= 1.5 cm not described
below (TR2): 0

_________________________________________________________

No discrete nodules of consequence are seen within the thyroid
gland. Incidental note is made of small nodules in the right
inferior gland which do not meet imaging criteria to warrant further
evaluation. The previously noted nodule in the right mid gland is
not seen on today's study and appears to have involuted.
IMPRESSION: 1. Enlarged and mildly heterogeneous thyroid gland.
2. Previously identified right mid thyroid nodule is no longer
present and appears to have involuted since the prior study.
3. No thyroid nodules of consequence on today's study that would
warrant further evaluation or follow-up.

The above is in keeping with the ACR TI-RADS recommendations - [HOSPITAL] 7133;[DATE].

## 2023-02-08 DIAGNOSIS — Z1231 Encounter for screening mammogram for malignant neoplasm of breast: Secondary | ICD-10-CM | POA: Diagnosis not present

## 2023-02-18 DIAGNOSIS — Z01419 Encounter for gynecological examination (general) (routine) without abnormal findings: Secondary | ICD-10-CM | POA: Diagnosis not present

## 2023-09-04 DIAGNOSIS — H6123 Impacted cerumen, bilateral: Secondary | ICD-10-CM | POA: Diagnosis not present

## 2024-02-19 DIAGNOSIS — Z01419 Encounter for gynecological examination (general) (routine) without abnormal findings: Secondary | ICD-10-CM | POA: Diagnosis not present

## 2024-02-19 DIAGNOSIS — Z1231 Encounter for screening mammogram for malignant neoplasm of breast: Secondary | ICD-10-CM | POA: Diagnosis not present

## 2024-02-27 DIAGNOSIS — N6325 Unspecified lump in the left breast, overlapping quadrants: Secondary | ICD-10-CM | POA: Diagnosis not present

## 2024-02-27 DIAGNOSIS — R92332 Mammographic heterogeneous density, left breast: Secondary | ICD-10-CM | POA: Diagnosis not present

## 2024-02-27 DIAGNOSIS — R922 Inconclusive mammogram: Secondary | ICD-10-CM | POA: Diagnosis not present

## 2024-03-03 DIAGNOSIS — E2839 Other primary ovarian failure: Secondary | ICD-10-CM | POA: Diagnosis not present

## 2024-03-03 DIAGNOSIS — M8588 Other specified disorders of bone density and structure, other site: Secondary | ICD-10-CM | POA: Diagnosis not present
